# Patient Record
Sex: Male | Born: 1985 | Race: White | Hispanic: No | Marital: Single | Smoking: Current every day smoker
Health system: Southern US, Community
[De-identification: ages and names within clinical notes are randomized; demographics above are authoritative.]

## PROBLEM LIST (undated history)

## (undated) HISTORY — PX: MANDIBLE SURGERY: SHX707

---

## 2009-03-03 ENCOUNTER — Emergency Department (HOSPITAL_COMMUNITY): Admission: EM | Admit: 2009-03-03 | Discharge: 2009-03-03 | Payer: Self-pay | Admitting: *Deleted

## 2009-09-12 ENCOUNTER — Emergency Department: Payer: Self-pay | Admitting: Emergency Medicine

## 2010-01-29 ENCOUNTER — Emergency Department: Payer: Self-pay | Admitting: Emergency Medicine

## 2010-10-12 ENCOUNTER — Emergency Department: Payer: Self-pay | Admitting: Unknown Physician Specialty

## 2012-01-14 ENCOUNTER — Emergency Department: Payer: Self-pay | Admitting: Neurological Surgery

## 2012-01-20 ENCOUNTER — Emergency Department: Payer: Self-pay | Admitting: Emergency Medicine

## 2012-04-19 ENCOUNTER — Emergency Department: Payer: Self-pay | Admitting: Emergency Medicine

## 2012-04-22 LAB — BETA STREP CULTURE(ARMC)

## 2013-06-06 ENCOUNTER — Encounter (HOSPITAL_COMMUNITY): Payer: Self-pay

## 2013-06-06 ENCOUNTER — Emergency Department (HOSPITAL_COMMUNITY)
Admission: EM | Admit: 2013-06-06 | Discharge: 2013-06-06 | Disposition: A | Discharge status: Home / Self Care | Payer: Self-pay | Attending: Emergency Medicine | Admitting: Emergency Medicine

## 2013-06-06 DIAGNOSIS — F17200 Nicotine dependence, unspecified, uncomplicated: Secondary | ICD-10-CM | POA: Insufficient documentation

## 2013-06-06 DIAGNOSIS — K029 Dental caries, unspecified: Secondary | ICD-10-CM | POA: Insufficient documentation

## 2013-06-06 DIAGNOSIS — K0889 Other specified disorders of teeth and supporting structures: Secondary | ICD-10-CM

## 2013-06-06 DIAGNOSIS — Z880 Allergy status to penicillin: Secondary | ICD-10-CM | POA: Insufficient documentation

## 2013-06-06 DIAGNOSIS — K089 Disorder of teeth and supporting structures, unspecified: Secondary | ICD-10-CM | POA: Insufficient documentation

## 2013-06-06 MED ORDER — CEPHALEXIN 500 MG PO CAPS: 500.0000 mg | ORAL_CAPSULE | Freq: Four times a day (QID) | ORAL | Status: DC

## 2013-06-06 MED ORDER — HYDROCODONE-ACETAMINOPHEN 5-325 MG PO TABS: ORAL_TABLET | ORAL | Status: DC

## 2013-06-06 MED ORDER — OXYCODONE-ACETAMINOPHEN 5-325 MG PO TABS
1.0000 | ORAL_TABLET | Freq: Once | ORAL | Status: AC
  Administered 2013-06-06: 1 via ORAL
  Filled 2013-06-06: qty 1

## 2013-06-06 MED ORDER — CEPHALEXIN 500 MG PO CAPS
500.0000 mg | ORAL_CAPSULE | Freq: Once | ORAL | Status: AC
  Administered 2013-06-06: 500 mg via ORAL
  Filled 2013-06-06: qty 1

## 2013-06-06 NOTE — ED Notes (Signed)
Pain lt upper molar, that broke today when eating.

## 2013-06-06 NOTE — ED Provider Notes (Signed)
Medical screening examination/treatment/procedure(s) were performed by non-physician practitioner and as supervising physician I was immediately available for consultation/collaboration.   Lyanne Co, MD 06/06/13 [phone]

## 2013-06-06 NOTE — ED Notes (Signed)
Pt c/o toothache since eating lunch.  Says broke his tooth while eating some peanut brittle today.

## 2013-06-06 NOTE — ED Provider Notes (Signed)
History    CSN: [id number] Arrival date & time 06/06/13  1620  First MD Initiated Contact with Patient 06/06/13 1723     Chief Complaint  Patient presents with  . Dental Pain   (Consider location/radiation/quality/duration/timing/severity/associated sxs/prior Treatment) Patient is a 27 y.o. male presenting with tooth pain. The history is provided by the patient.  Dental Pain Location:  Upper Upper teeth location:  12/LU 1st bicuspid Quality:  Dull and throbbing Severity:  Moderate Onset quality:  Sudden Duration:  6 hours Timing:  Constant Progression:  Unchanged Chronicity:  New Context: dental caries, filling fell out and poor dentition   Context: not trauma   Prior workup: none. Relieved by:  Heat Worsened by:  Cold food/drink Ineffective treatments:  None tried Associated symptoms: no congestion, no difficulty swallowing, no drooling, no facial pain, no facial swelling, no fever, no gum swelling, no headaches, no neck pain, no neck swelling, no oral bleeding, no oral lesions and no trismus   Risk factors: periodontal disease and smoking    History reviewed. No pertinent past medical history. Past Surgical History  Procedure Laterality Date  . Mandible surgery     No family history on file. History  Substance Use Topics  . Smoking status: Current Every Day Smoker  . Smokeless tobacco: Not on file  . Alcohol Use: No    Review of Systems  Constitutional: Negative for fever and appetite change.  HENT: Positive for dental problem. Negative for congestion, sore throat, facial swelling, drooling, mouth sores, trouble swallowing, neck pain and neck stiffness.   Eyes: Negative for pain and visual disturbance.  Neurological: Negative for dizziness, facial asymmetry and headaches.  Hematological: Negative for adenopathy.  All other systems reviewed and are negative.    Allergies  Penicillins and Sulfa antibiotics  Home Medications   Current Outpatient Rx  Name   Route  Sig  Dispense  Refill  . ibuprofen (ADVIL,MOTRIN) 200 MG tablet   Oral   Take 200 mg by mouth every 6 (six) hours as needed for pain.          BP 135/79  Pulse 81  SpO2 100% Physical Exam  Nursing note and vitals reviewed. Constitutional: He is oriented to person, place, and time. He appears well-developed and well-nourished. No distress.  HENT:  Head: Normocephalic and atraumatic. No trismus in the jaw.  Right Ear: Tympanic membrane and ear canal normal.  Left Ear: Tympanic membrane and ear canal normal.  Mouth/Throat: Uvula is midline, oropharynx is clear and moist and mucous membranes are normal. Dental caries present. No dental abscesses or edematous.    Dental caries and ttp of the left upper bicuspid. No trismus, facial edema or dental abscess  Neck: Normal range of motion. Neck supple.  Cardiovascular: Normal rate, regular rhythm and normal heart sounds.   No murmur heard. Pulmonary/Chest: Effort normal and breath sounds normal. No respiratory distress.  Musculoskeletal: Normal range of motion.  Lymphadenopathy:    He has no cervical adenopathy.  Neurological: He is alert and oriented to person, place, and time. He exhibits normal muscle tone. Coordination normal.  Skin: Skin is warm and dry.    ED Course  Procedures (including critical care time) Labs Reviewed - No data to display   MDM   ttp of the left upper bicuspid.  Dental decay preset w/o trismus, facial swelling, or obvious abscess.  Pt agrees to warm salt water rinses and close dental follow-up.  VSS.  Appears stable for discharge  [place] Katich L. Trisha Mangle, PA-C 06/06/13 1809

## 2013-07-19 ENCOUNTER — Encounter (HOSPITAL_COMMUNITY): Payer: Self-pay | Admitting: *Deleted

## 2013-07-19 ENCOUNTER — Emergency Department (HOSPITAL_COMMUNITY)
Admission: EM | Admit: 2013-07-19 | Discharge: 2013-07-19 | Disposition: A | Discharge status: Home / Self Care | Payer: Medicaid Other | Attending: Emergency Medicine | Admitting: Emergency Medicine

## 2013-07-19 ENCOUNTER — Emergency Department (HOSPITAL_COMMUNITY): Payer: Medicaid Other

## 2013-07-19 DIAGNOSIS — Z880 Allergy status to penicillin: Secondary | ICD-10-CM | POA: Insufficient documentation

## 2013-07-19 DIAGNOSIS — IMO0002 Reserved for concepts with insufficient information to code with codable children: Secondary | ICD-10-CM | POA: Insufficient documentation

## 2013-07-19 DIAGNOSIS — S0003XA Contusion of scalp, initial encounter: Secondary | ICD-10-CM | POA: Insufficient documentation

## 2013-07-19 DIAGNOSIS — Z9889 Other specified postprocedural states: Secondary | ICD-10-CM | POA: Insufficient documentation

## 2013-07-19 DIAGNOSIS — S0083XA Contusion of other part of head, initial encounter: Secondary | ICD-10-CM

## 2013-07-19 DIAGNOSIS — F17200 Nicotine dependence, unspecified, uncomplicated: Secondary | ICD-10-CM | POA: Insufficient documentation

## 2013-07-19 DIAGNOSIS — Y92009 Unspecified place in unspecified non-institutional (private) residence as the place of occurrence of the external cause: Secondary | ICD-10-CM | POA: Insufficient documentation

## 2013-07-19 DIAGNOSIS — Y9389 Activity, other specified: Secondary | ICD-10-CM | POA: Insufficient documentation

## 2013-07-19 MED ORDER — IBUPROFEN 600 MG PO TABS: 600.0000 mg | ORAL_TABLET | Freq: Four times a day (QID) | ORAL | Status: DC | PRN

## 2013-07-19 MED ORDER — OXYCODONE-ACETAMINOPHEN 5-325 MG PO TABS: 1.0000 | ORAL_TABLET | ORAL | Status: DC | PRN

## 2013-07-19 NOTE — ED Notes (Signed)
Pt was trying to take a garage door out at his house and he kicked a 2x4 and it snapped back and hit him in the left side of his face. Pt has had his left jaw broken twice and he also has screws in his left jaw. Pt c/o numbness to face.

## 2013-07-19 NOTE — ED Notes (Signed)
Pt alert & oriented x4, stable gait. Patient given discharge instructions, paperwork & prescription(s). Patient  instructed to stop at the registration desk to finish any additional paperwork. Patient verbalized understanding. Pt left department w/ no further questions. 

## 2013-07-22 NOTE — ED Provider Notes (Signed)
CSN: [id number]     Arrival date & time 07/19/13  1829 History   First MD Initiated Contact with Patient 07/19/13 2011     Chief Complaint  Patient presents with  . Facial Injury   (Consider location/radiation/quality/duration/timing/severity/associated sxs/prior Treatment) HPI Comments: Matthew Odonnell is a 27 y.o. Male presenting for evaluation of left facial injury and pain after he was struck by a flying piece of 2x4 while he was tearing down a garage door at his home.  The injury occurred just prior to arrival.  He describes constant pain across his left jaw which is worsened with palpation and when he extends the jaw.  He has a history of prior jaw traumas including one with surgical repair. He feels numb along the left jawline.  He has had  No medcines or treatment prior to arrival and has found no alleviators.  He denies head injury or syncope from the event.     The history is provided by the patient.    History reviewed. No pertinent past medical history. Past Surgical History  Procedure Laterality Date  . Mandible surgery     History reviewed. No pertinent family history. History  Substance Use Topics  . Smoking status: Current Every Day Smoker  . Smokeless tobacco: Not on file  . Alcohol Use: No    Review of Systems  Constitutional: Negative for fever.  HENT: Negative for hearing loss, ear pain, nosebleeds, congestion, sore throat, facial swelling, sneezing, neck pain and neck stiffness.   Eyes: Negative.   Respiratory: Negative for chest tightness and shortness of breath.   Cardiovascular: Negative for chest pain.  Gastrointestinal: Negative for nausea and abdominal pain.  Genitourinary: Negative.   Musculoskeletal: Negative for joint swelling and arthralgias.  Skin: Negative.  Negative for rash and wound.  Neurological: Negative for dizziness, weakness, light-headedness, numbness and headaches.  Psychiatric/Behavioral: Negative.     Allergies  Penicillins and  Sulfa antibiotics  Home Medications   Current Outpatient Rx  Name  Route  Sig  Dispense  Refill  . HYDROcodone-acetaminophen (NORCO/VICODIN) 5-325 MG per tablet   Oral   Take 1 tablet by mouth once as needed for pain.         Marland Kitchen ibuprofen (ADVIL,MOTRIN) 200 MG tablet   Oral   Take 200 mg by mouth every 6 (six) hours as needed for pain.         Marland Kitchen ibuprofen (ADVIL,MOTRIN) 600 MG tablet   Oral   Take 1 tablet (600 mg total) by mouth every 6 (six) hours as needed for pain.   20 tablet   0   . oxyCODONE-acetaminophen (PERCOCET/ROXICET) 5-325 MG per tablet   Oral   Take 1 tablet by mouth every 4 (four) hours as needed for pain.   12 tablet   0    BP 132/80  Pulse 106  Temp(Src) 98.6 F (37 C)  Resp 20  Ht 5\' 11"  (1.803 m)  Wt 140 lb (63.504 kg)  BMI 19.53 kg/m2  SpO2 100% Physical Exam  Nursing note and vitals reviewed. Constitutional: He appears well-developed and well-nourished.  HENT:  Head: Normocephalic.    Left Ear: No swelling or tenderness. No mastoid tenderness. No hemotympanum.  Mouth/Throat: Uvula is midline. Normal dentition.  ttp along left mandible, mild erythema without edema or induration.  FROM of mandible without crepitus.  Eyes: Conjunctivae are normal.  Neck: Normal range of motion.  Cardiovascular: Normal rate, regular rhythm, normal heart sounds and intact distal pulses.  Pulmonary/Chest: Effort normal and breath sounds normal. He has no wheezes.  Abdominal: Soft. Bowel sounds are normal. There is no tenderness.  Musculoskeletal: Normal range of motion.  Neurological: He is alert. A sensory deficit is present. No cranial nerve deficit.  Decreased sensation left jaw/mandibular branch of trigeminal nerve.  Skin: Skin is warm and dry.  Psychiatric: He has a normal mood and affect.    ED Course  Procedures (including critical care time) Labs Review Labs Reviewed - No data to display Imaging Review No results found.  MDM   1. Facial  contusion, initial encounter    Patients labs and/or radiological studies were viewed and considered during the medical decision making and disposition process. Pt was prescribed oxycodone, ibuprofen, encouraged ice packs for the next 2 days prn, expect gradual improvement.  No cranial nerve motor deficit on exam but decreased fine touch left face along trigeminal nerve mandible.  Expect this to improve with resolution of trauma, which was discussed with pt,  However advised f/u with ent if it does improve over the next week,  Referral given.    Burgess Amor, PA-C 07/22/13 1550

## 2013-07-25 ENCOUNTER — Encounter (HOSPITAL_COMMUNITY): Payer: Self-pay | Admitting: *Deleted

## 2013-07-25 ENCOUNTER — Emergency Department (HOSPITAL_COMMUNITY)
Admission: EM | Admit: 2013-07-25 | Discharge: 2013-07-25 | Disposition: A | Discharge status: Home / Self Care | Payer: Medicaid Other | Attending: Emergency Medicine | Admitting: Emergency Medicine

## 2013-07-25 DIAGNOSIS — Z880 Allergy status to penicillin: Secondary | ICD-10-CM | POA: Insufficient documentation

## 2013-07-25 DIAGNOSIS — Z9889 Other specified postprocedural states: Secondary | ICD-10-CM | POA: Insufficient documentation

## 2013-07-25 DIAGNOSIS — F17200 Nicotine dependence, unspecified, uncomplicated: Secondary | ICD-10-CM | POA: Insufficient documentation

## 2013-07-25 DIAGNOSIS — R6884 Jaw pain: Secondary | ICD-10-CM

## 2013-07-25 DIAGNOSIS — R209 Unspecified disturbances of skin sensation: Secondary | ICD-10-CM | POA: Insufficient documentation

## 2013-07-25 MED ORDER — TRAMADOL HCL 50 MG PO TABS: 50.0000 mg | ORAL_TABLET | Freq: Four times a day (QID) | ORAL | Status: DC | PRN

## 2013-07-25 MED ORDER — TRAMADOL HCL 50 MG PO TABS
50.0000 mg | ORAL_TABLET | Freq: Once | ORAL | Status: AC
  Administered 2013-07-25: 50 mg via ORAL
  Filled 2013-07-25: qty 1

## 2013-07-25 NOTE — ED Notes (Signed)
Pt hit in face last week while taking down a building. Pt seen in ER then. Unable to follow up at this time. Pt states still having pain & lip is numb.

## 2013-07-25 NOTE — ED Provider Notes (Signed)
CSN: [id number]     Arrival date & time 07/25/13  1948 History   First MD Initiated Contact with Patient 07/25/13 2031     Chief Complaint  Patient presents with  . Facial Pain   (Consider location/radiation/quality/duration/timing/severity/associated sxs/prior Treatment) HPI Comments: Matthew Odonnell is a 27 y.o. male who presents to the Emergency Department complaining of persistent pain to the left jaw.  He was seen here on 07/19/13 for same after a direct blow to his jaw by a piece of wood that fell while he was tearing down a shed.  Patient reports having a previous mandible fracture in the same area. He also c/o numbness and tingling to his lower lip and along the jaw that has also been present since the initial injury.  He states that he has made an appt with an ENT doctor, but his appt is not until next week and he has ran out of the pain medication he was prescribed.  He denies neck pain, visual changes, headaches, vomiting or dizziness.    The history is provided by the patient.    History reviewed. No pertinent past medical history. Past Surgical History  Procedure Laterality Date  . Mandible surgery     No family history on file. History  Substance Use Topics  . Smoking status: Current Every Day Smoker  . Smokeless tobacco: Not on file  . Alcohol Use: No    Review of Systems  Constitutional: Negative for fever, chills, activity change and appetite change.  HENT: Negative for ear pain, sore throat, facial swelling, mouth sores, trouble swallowing, neck pain, neck stiffness and voice change.        Pain to left jaw  Eyes: Negative for visual disturbance.  Skin: Negative for rash and wound.  Neurological: Negative for dizziness, facial asymmetry, speech difficulty, weakness, light-headedness, numbness and headaches.  All other systems reviewed and are negative.    Allergies  Penicillins and Sulfa antibiotics  Home Medications   Current Outpatient Rx  Name  Route  Sig   Dispense  Refill  . HYDROcodone-acetaminophen (NORCO/VICODIN) 5-325 MG per tablet   Oral   Take 1 tablet by mouth once as needed for pain.         Marland Kitchen ibuprofen (ADVIL,MOTRIN) 200 MG tablet   Oral   Take 200 mg by mouth every 6 (six) hours as needed for pain.         Marland Kitchen ibuprofen (ADVIL,MOTRIN) 600 MG tablet   Oral   Take 1 tablet (600 mg total) by mouth every 6 (six) hours as needed for pain.   20 tablet   0   . oxyCODONE-acetaminophen (PERCOCET/ROXICET) 5-325 MG per tablet   Oral   Take 1 tablet by mouth every 4 (four) hours as needed for pain.   12 tablet   0    BP 149/86  Pulse 120  Temp(Src) 98 F (36.7 C) (Oral)  Resp 20  Ht 5\' 11"  (1.803 m)  Wt 140 lb (63.504 kg)  BMI 19.53 kg/m2  SpO2 100% Physical Exam  Nursing note and vitals reviewed. Constitutional: He is oriented to person, place, and time. He appears well-developed and well-nourished. No distress.  HENT:  Head: Normocephalic.    Mouth/Throat: Uvula is midline, oropharynx is clear and moist and mucous membranes are normal. No oral lesions. No trismus in the jaw. Dental caries present. No edematous or lacerations.  Patient has ttp of the lower mid mandible,  No bony deformity, edema, or dental abscess.  No trismus or malocclusion.  Mild edema of the left lower lip with slight abrasion of the lip present.  No laceration  Eyes: Conjunctivae and EOM are normal. Pupils are equal, round, and reactive to light.  Neck: Normal range of motion, full passive range of motion without pain and phonation normal. Neck supple.  Cardiovascular: Normal rate, regular rhythm, normal heart sounds and intact distal pulses.   No murmur heard. Pulmonary/Chest: Effort normal and breath sounds normal. No respiratory distress. He exhibits no tenderness.  Musculoskeletal: Normal range of motion. He exhibits no edema.  Lymphadenopathy:    He has no cervical adenopathy.  Neurological: He is alert and oriented to person, place, and  time. He exhibits normal muscle tone. Coordination normal.  Skin: Skin is warm and dry.    ED Course  Procedures (including critical care time) Labs Review Labs Reviewed - No data to display Imaging Review No results found.  MDM    Previous ED chart reviewed by me including CT results.  [place] narcotics database reviewed.  Patient continues to have pain to the mid left mandible. No edema, erythema, or TMJ tenderness. Patient has full range of motion of the jaw. Decreased pinpoint sensation along the left lower jaw line. There is mild bruising of the left lower lip. Patient reports these issues have not diminished since his previous visit. Patient does report having a followup appointment with ENT next week.  Have explained to the patient that the ER is not the proper location for chronic pain management he verbalizes understanding and agrees to the care plan.  Patient is well appearing and stable for discharge   Safaa Stingley L. Perris Conwell, PA-C 07/27/13 0106

## 2013-07-25 NOTE — ED Notes (Signed)
Pain lt mandible, says he was hit with a 2x4 last week and was seen here for same. . Numb sensation to chin,

## 2013-07-26 NOTE — ED Provider Notes (Signed)
Medical screening examination/treatment/procedure(s) were performed by non-physician practitioner and as supervising physician I was immediately available for consultation/collaboration.'   Alezandra Egli W Adah Stoneberg, MD 07/26/13 1138 

## 2013-07-28 NOTE — ED Provider Notes (Signed)
Medical screening examination/treatment/procedure(s) were performed by non-physician practitioner and as supervising physician I was immediately available for consultation/collaboration.  Damon Baisch, MD 07/28/13 1148 

## 2014-01-02 ENCOUNTER — Emergency Department: Payer: Self-pay | Admitting: Emergency Medicine

## 2014-04-06 ENCOUNTER — Emergency Department: Payer: Self-pay | Admitting: Emergency Medicine

## 2014-04-06 LAB — URINALYSIS, COMPLETE
Bacteria: NONE SEEN
Bilirubin,UR: NEGATIVE
Glucose,UR: NEGATIVE mg/dL
Ketone: NEGATIVE
Leukocyte Esterase: NEGATIVE
Nitrite: NEGATIVE
Ph: 6
Protein: NEGATIVE
RBC,UR: 698 /HPF
Specific Gravity: 1.012
Squamous Epithelial: NONE SEEN
WBC UR: 3 /HPF

## 2015-03-05 ENCOUNTER — Emergency Department
Admit: 2015-03-05 | Disposition: A | Discharge status: Home / Self Care | Payer: Self-pay | Admitting: Emergency Medicine

## 2015-09-07 ENCOUNTER — Emergency Department
Admission: EM | Admit: 2015-09-07 | Discharge: 2015-09-07 | Disposition: A | Discharge status: Home / Self Care | Payer: Self-pay | Attending: Emergency Medicine | Admitting: Emergency Medicine

## 2015-09-07 ENCOUNTER — Encounter: Payer: Self-pay | Admitting: Emergency Medicine

## 2015-09-07 DIAGNOSIS — K047 Periapical abscess without sinus: Secondary | ICD-10-CM | POA: Insufficient documentation

## 2015-09-07 DIAGNOSIS — Z720 Tobacco use: Secondary | ICD-10-CM | POA: Insufficient documentation

## 2015-09-07 DIAGNOSIS — Z880 Allergy status to penicillin: Secondary | ICD-10-CM | POA: Insufficient documentation

## 2015-09-07 MED ORDER — LIDOCAINE-EPINEPHRINE 2 %-1:100000 IJ SOLN
1.7000 mL | Freq: Once | INTRAMUSCULAR | Status: AC
Start: 2015-09-07 — End: 2015-09-07
  Administered 2015-09-07: 1.7 mL
  Filled 2015-09-07: qty 1.7

## 2015-09-07 MED ORDER — DOXYCYCLINE HYCLATE 100 MG PO TABS: 100.0000 mg | ORAL_TABLET | Freq: Two times a day (BID) | ORAL | Status: DC

## 2015-09-07 MED ORDER — MAGIC MOUTHWASH W/LIDOCAINE: 5.0000 mL | Freq: Four times a day (QID) | ORAL | Status: DC

## 2015-09-07 MED ORDER — OXYCODONE-ACETAMINOPHEN 5-325 MG PO TABS: 1.0000 | ORAL_TABLET | Freq: Four times a day (QID) | ORAL | Status: DC | PRN

## 2015-09-07 NOTE — ED Provider Notes (Signed)
[facility] Emergency Department Provider Note  ____________________________________________  Time seen: Approximately 6:09 PM  I have reviewed the triage vital signs and the nursing notes.   HISTORY  Chief Complaint Dental Pain    HPI Matthew Odonnell is a 29 y.o. male who presents to emergency department complaining of left upper jaw/tooth pain. He states that he has had intermittent pain over the last couple months but over the last 24 hours the pain is increased and he is experiencing swelling to the lateral aspect of his face. He denies any fevers or chills. He reports one episode of vomiting that was clear.Marland Kitchen. He denies any difficulty breathing or swallowing. He states that he has poor dentition but does not have a regular dentist. States the pain is severe.   History reviewed. No pertinent past medical history.  There are no active problems to display for this patient.   Past Surgical History  Procedure Laterality Date  . Mandible surgery      Current Outpatient Rx  Name  Route  Sig  Dispense  Refill  . doxycycline (VIBRA-TABS) 100 MG tablet   Oral   Take 1 tablet (100 mg total) by mouth 2 (two) times daily.   14 tablet   0   . HYDROcodone-acetaminophen (NORCO/VICODIN) 5-325 MG per tablet   Oral   Take 1 tablet by mouth once as needed for pain.         Marland Kitchen. ibuprofen (ADVIL,MOTRIN) 200 MG tablet   Oral   Take 200 mg by mouth every 6 (six) hours as needed for pain.         Marland Kitchen. ibuprofen (ADVIL,MOTRIN) 600 MG tablet   Oral   Take 1 tablet (600 mg total) by mouth every 6 (six) hours as needed for pain.   20 tablet   0   . magic mouthwash w/lidocaine SOLN   Oral   Take 5 mLs by mouth 4 (four) times daily.   240 mL   0     Dispense in a 1/1/1/1 ratio   . oxyCODONE-acetaminophen (ROXICET) 5-325 MG tablet   Oral   Take 1 tablet by mouth every 6 (six) hours as needed for severe pain.   20 tablet   0   . traMADol (ULTRAM) 50 MG  tablet   Oral   Take 1 tablet (50 mg total) by mouth every 6 (six) hours as needed for pain.   15 tablet   0     Allergies Penicillins and Sulfa antibiotics  No family history on file.  Social History Social History  Substance Use Topics  . Smoking status: Current Every Day Smoker  . Smokeless tobacco: None  . Alcohol Use: No    Review of Systems Constitutional: No fever/chills Eyes: No visual changes. ENT: No sore throat. Endorses left-sided dental pain. Cardiovascular: Denies chest pain. Respiratory: Denies shortness of breath. Gastrointestinal: No abdominal pain.  No nausea, no vomiting.  No diarrhea.  No constipation. Genitourinary: Negative for dysuria. Musculoskeletal: Negative for back pain. Skin: Negative for rash. Neurological: Negative for headaches, focal weakness or numbness.  10-point ROS otherwise negative.  ____________________________________________   PHYSICAL EXAM:  VITAL SIGNS: ED Triage Vitals  Enc Vitals Group     BP 09/07/15 1745 141/83 mmHg     Pulse Rate 09/07/15 1745 63     Resp 09/07/15 1745 18     Temp 09/07/15 1745 97.9 F (36.6 C)     Temp Source 09/07/15 1745 Oral  SpO2 09/07/15 1745 100 %     Weight 09/07/15 1745 140 lb (63.504 kg)     Height 09/07/15 1745  (1.753 m)     Head Cir --      Peak Flow --      Pain Score 09/07/15 1745 8     Pain Loc --      Pain Edu? --      Excl. in GC? --     Constitutional: Alert and oriented. Well appearing and in no acute distress. Eyes: Conjunctivae are normal. PERRL. EOMI. Head: Atraumatic. Nose: No congestion/rhinnorhea. Mouth/Throat: Mucous membranes are moist.  Oropharynx non-erythematous. Neck: No stridor.   Hematological/Lymphatic/Immunilogical: No cervical lymphadenopathy. Cardiovascular: Normal rate, regular rhythm. Grossly normal heart sounds.  Good peripheral circulation. Respiratory: Normal respiratory effort.  No retractions. Lungs CTAB. Gastrointestinal: Soft and  nontender. No distention. No abdominal bruits. No CVA tenderness. Musculoskeletal: No lower extremity tenderness nor edema.  No joint effusions. Neurologic:  Normal speech and language. No gross focal neurologic deficits are appreciated. No gait instability. Skin:  Skin is warm, dry and intact. No rash noted. Psychiatric: Mood and affect are normal. Speech and behavior are normal.  ____________________________________________   LABS (all labs ordered are listed, but only abnormal results are displayed)  Labs Reviewed - No data to display ____________________________________________  EKG   ____________________________________________  RADIOLOGY   ____________________________________________   PROCEDURES  Procedure(s) performed: Yes, dental block. 1.7 MLS of lidocaine with epi were administered for dental block to the left upper jaw. He tolerated procedure well. Her scalp patient.  Critical Care performed: No  ____________________________________________   INITIAL IMPRESSION / ASSESSMENT AND PLAN / ED COURSE  Pertinent labs & imaging results that were available during my care of the patient were reviewed by me and considered in my medical decision making (see chart for details).  The patient is a 29 year old male who presents to emergency department complaining of left upper jaw dental pain. He endorses some swelling. Exam was consistent with dental abscess. The patient is placed on antibiotics, Percocet, and magic mouthwash for treatment. Patient is advised to follow-up with [facility] for further evaluation and treatment. Patient verbalizes understanding of diagnosis and verbalizes understanding of the treatment plan. He verbalizes compliance with same. ____________________________________________   FINAL CLINICAL IMPRESSION(S) / ED DIAGNOSES  Final diagnoses:  Dental abscess      Racheal Patches, PA-C 09/07/15 1857  Governor Rooks, MD 09/09/15 1108

## 2015-09-07 NOTE — ED Notes (Signed)
C/o toothache pain to left side with facial swelling

## 2015-09-07 NOTE — Discharge Instructions (Signed)
Dental Abscess °A dental abscess is a collection of pus in or around a tooth. °CAUSES °This condition is caused by a bacterial infection around the root of the tooth that involves the inner part of the tooth (pulp). It may result from: °· Severe tooth decay. °· Trauma to the tooth that allows bacteria to enter into the pulp, such as a broken or chipped tooth. °· Severe gum disease around a tooth. °SYMPTOMS °Symptoms of this condition include: °· Severe pain in and around the infected tooth. °· Swelling and redness around the infected tooth, in the mouth, or in the face. °· Tenderness. °· Pus drainage. °· Bad breath. °· Bitter taste in the mouth. °· Difficulty swallowing. °· Difficulty opening the mouth. °· Nausea. °· Vomiting. °· Chills. °· Swollen neck glands. °· Fever. °DIAGNOSIS °This condition is diagnosed with examination of the infected tooth. During the exam, your dentist may tap on the infected tooth. Your dentist will also ask about your medical and dental history and may order X-rays. °TREATMENT °This condition is treated by eliminating the infection. This may be done with: °· Antibiotic medicine. °· A root canal. This may be performed to save the tooth. °· Pulling (extracting) the tooth. This may also involve draining the abscess. This is done if the tooth cannot be saved. °HOME CARE INSTRUCTIONS °· Take medicines only as directed by your dentist. °· If you were prescribed antibiotic medicine, finish all of it even if you start to feel better. °· Rinse your mouth (gargle) often with salt water to relieve pain or swelling. °· Do not drive or operate heavy machinery while taking pain medicine. °· Do not apply heat to the outside of your mouth. °· Keep all follow-up visits as directed by your dentist. This is important. °SEEK MEDICAL CARE IF: °· Your pain is worse and is not helped by medicine. °SEEK IMMEDIATE MEDICAL CARE IF: °· You have a fever or chills. °· Your symptoms suddenly get worse. °· You have a  very bad headache. °· You have problems breathing or swallowing. °· You have trouble opening your mouth. °· You have swelling in your neck or around your eye. °  °This information is not intended to replace advice given to you by your health care provider. Make sure you discuss any questions you have with your health care provider. °  °Document Released: 11/03/2005 Document Revised: 03/20/2015 Document Reviewed: 10/31/2014 °Elsevier Interactive Patient Education ©2016 Elsevier Inc. ° °Dental Pain °Dental pain may be caused by many things, including: °· Tooth decay (cavities or caries). Cavities expose the nerve of your tooth to air and hot or cold temperatures. This can cause pain or discomfort. °· Abscess or infection. A dental abscess is a collection of infected pus from a bacterial infection in the inner part of the tooth (pulp). It usually occurs at the end of the tooth's root. °· Injury. °· An unknown reason (idiopathic). °Your pain may be mild or severe. It may only occur when: °· You are chewing. °· You are exposed to hot or cold temperature. °· You are eating or drinking sugary foods or beverages, such as soda or candy. °Your pain may also be constant. °HOME CARE INSTRUCTIONS °Watch your dental pain for any changes. The following actions may help to lessen any discomfort that you are feeling: °· Take medicines only as directed by your dentist. °· If you were prescribed an antibiotic medicine, finish all of it even if you start to feel better. °· Keep all   follow-up visits as directed by your dentist. This is important. °· Do not apply heat to the outside of your face. °· Rinse your mouth or gargle with salt water if directed by your dentist. This helps with pain and swelling. °¨ You can make salt water by adding ¼ tsp of salt to 1 cup of warm water. °· Apply ice to the painful area of your face: °¨ Put ice in a plastic bag. °¨ Place a towel between your skin and the bag. °¨ Leave the ice on for 20 minutes,  2-3 times per day. °· Avoid foods or drinks that cause you pain, such as: °¨ Very hot or very cold foods or drinks. °¨ Sweet or sugary foods or drinks. °SEEK MEDICAL CARE IF: °· Your pain is not controlled with medicines. °· Your symptoms are worse. °· You have new symptoms. °SEEK IMMEDIATE MEDICAL CARE IF: °· You are unable to open your mouth. °· You are having trouble breathing or swallowing. °· You have a fever. °· Your face, neck, or jaw is swollen. °  °This information is not intended to replace advice given to you by your health care provider. Make sure you discuss any questions you have with your health care provider. °  °Document Released: 11/03/2005 Document Revised: 03/20/2015 Document Reviewed: 10/30/2014 °Elsevier Interactive Patient Education ©2016 Elsevier Inc. ° °

## 2015-10-31 ENCOUNTER — Encounter: Payer: Self-pay | Admitting: *Deleted

## 2015-10-31 ENCOUNTER — Emergency Department
Admission: EM | Admit: 2015-10-31 | Discharge: 2015-10-31 | Disposition: A | Discharge status: Home / Self Care | Payer: Medicaid Other | Attending: Emergency Medicine | Admitting: Emergency Medicine

## 2015-10-31 DIAGNOSIS — F17200 Nicotine dependence, unspecified, uncomplicated: Secondary | ICD-10-CM | POA: Insufficient documentation

## 2015-10-31 DIAGNOSIS — Z880 Allergy status to penicillin: Secondary | ICD-10-CM | POA: Insufficient documentation

## 2015-10-31 DIAGNOSIS — Z79899 Other long term (current) drug therapy: Secondary | ICD-10-CM | POA: Insufficient documentation

## 2015-10-31 DIAGNOSIS — Z792 Long term (current) use of antibiotics: Secondary | ICD-10-CM | POA: Insufficient documentation

## 2015-10-31 DIAGNOSIS — K029 Dental caries, unspecified: Secondary | ICD-10-CM | POA: Insufficient documentation

## 2015-10-31 DIAGNOSIS — K047 Periapical abscess without sinus: Secondary | ICD-10-CM

## 2015-10-31 MED ORDER — CLINDAMYCIN HCL 300 MG PO CAPS: 300.0000 mg | ORAL_CAPSULE | Freq: Four times a day (QID) | ORAL | Status: DC

## 2015-10-31 MED ORDER — TRAMADOL HCL 50 MG PO TABS: 50.0000 mg | ORAL_TABLET | Freq: Two times a day (BID) | ORAL | Status: DC

## 2015-10-31 NOTE — ED Notes (Signed)
Pt states 2 abscess teeth on the left upper, states pus pocked in his mouth

## 2015-10-31 NOTE — Discharge Instructions (Signed)
Dental Abscess °A dental abscess is a collection of pus in or around a tooth. °CAUSES °This condition is caused by a bacterial infection around the root of the tooth that involves the inner part of the tooth (pulp). It may result from: °· Severe tooth decay. °· Trauma to the tooth that allows bacteria to enter into the pulp, such as a broken or chipped tooth. °· Severe gum disease around a tooth. °SYMPTOMS °Symptoms of this condition include: °· Severe pain in and around the infected tooth. °· Swelling and redness around the infected tooth, in the mouth, or in the face. °· Tenderness. °· Pus drainage. °· Bad breath. °· Bitter taste in the mouth. °· Difficulty swallowing. °· Difficulty opening the mouth. °· Nausea. °· Vomiting. °· Chills. °· Swollen neck glands. °· Fever. °DIAGNOSIS °This condition is diagnosed with examination of the infected tooth. During the exam, your dentist may tap on the infected tooth. Your dentist will also ask about your medical and dental history and may order X-rays. °TREATMENT °This condition is treated by eliminating the infection. This may be done with: °· Antibiotic medicine. °· A root canal. This may be performed to save the tooth. °· Pulling (extracting) the tooth. This may also involve draining the abscess. This is done if the tooth cannot be saved. °HOME CARE INSTRUCTIONS °· Take medicines only as directed by your dentist. °· If you were prescribed antibiotic medicine, finish all of it even if you start to feel better. °· Rinse your mouth (gargle) often with salt water to relieve pain or swelling. °· Do not drive or operate heavy machinery while taking pain medicine. °· Do not apply heat to the outside of your mouth. °· Keep all follow-up visits as directed by your dentist. This is important. °SEEK MEDICAL CARE IF: °· Your pain is worse and is not helped by medicine. °SEEK IMMEDIATE MEDICAL CARE IF: °· You have a fever or chills. °· Your symptoms suddenly get worse. °· You have a  very bad headache. °· You have problems breathing or swallowing. °· You have trouble opening your mouth. °· You have swelling in your neck or around your eye. °  °This information is not intended to replace advice given to you by your health care provider. Make sure you discuss any questions you have with your health care provider. °  °Document Released: 11/03/2005 Document Revised: 03/20/2015 Document Reviewed: 10/31/2014 °Elsevier Interactive Patient Education ©2016 Elsevier Inc. ° °Dental Caries °Dental caries (also called tooth decay) is the most common oral disease. It can occur at any age but is more common in children and young adults.  °HOW DENTAL CARIES DEVELOPS  °The process of decay begins when bacteria and foods (particularly sugars and starches) combine in your mouth to produce plaque. Plaque is a substance that sticks to the hard, outer surface of a tooth (enamel). The bacteria in plaque produce acids that attack enamel. These acids may also attack the root surface of a tooth (cementum) if it is exposed. Repeated attacks dissolve these surfaces and create holes in the tooth (cavities). If left untreated, the acids destroy the other layers of the tooth.  °RISK FACTORS °· Frequent sipping of sugary beverages.   °· Frequent snacking on sugary and starchy foods, especially those that easily get stuck in the teeth.   °· Poor oral hygiene.   °· Dry mouth.   °· Substance abuse such as methamphetamine abuse.   °· Broken or poor-fitting dental restorations.   °· Eating disorders.   °· Gastroesophageal reflux disease (  GERD).   Certain radiation treatments to the head and neck. SYMPTOMS In the early stages of dental caries, symptoms are seldom present. Sometimes white, chalky areas may be seen on the enamel or other tooth layers. In later stages, symptoms may include:  Pits and holes on the enamel.  Toothache after sweet, hot, or cold foods or drinks are consumed.  Pain around the tooth.  Swelling  around the tooth. DIAGNOSIS  Most of the time, dental caries is detected during a regular dental checkup. A diagnosis is made after a thorough medical and dental history is taken and the surfaces of your teeth are checked for signs of dental caries. Sometimes special instruments, such as lasers, are used to check for dental caries. Dental X-ray exams may be taken so that areas not visible to the eye (such as between the contact areas of the teeth) can be checked for cavities.  TREATMENT  If dental caries is in its early stages, it may be reversed with a fluoride treatment or an application of a remineralizing agent at the dental office. Thorough brushing and flossing at home is needed to aid these treatments. If it is in its later stages, treatment depends on the location and extent of tooth destruction:   If a small area of the tooth has been destroyed, the destroyed area will be removed and cavities will be filled with a material such as gold, silver amalgam, or composite resin.   If a large area of the tooth has been destroyed, the destroyed area will be removed and a cap (crown) will be fitted over the remaining tooth structure.   If the center part of the tooth (pulp) is affected, a procedure called a root canal will be needed before a filling or crown can be placed.   If most of the tooth has been destroyed, the tooth may need to be pulled (extracted). HOME CARE INSTRUCTIONS You can prevent, stop, or reverse dental caries at home by practicing good oral hygiene. Good oral hygiene includes:  Thoroughly cleaning your teeth at least twice a day with a toothbrush and dental floss.   Using a fluoride toothpaste. A fluoride mouth rinse may also be used if recommended by your dentist or health care provider.   Restricting the amount of sugary and starchy foods and sugary liquids you consume.   Avoiding frequent snacking on these foods and sipping of these liquids.   Keeping regular  visits with a dentist for checkups and cleanings. PREVENTION   Practice good oral hygiene.  Consider a dental sealant. A dental sealant is a coating material that is applied by your dentist to the pits and grooves of teeth. The sealant prevents food from being trapped in them. It may protect the teeth for several years.  Ask about fluoride supplements if you live in a community without fluorinated water or with water that has a low fluoride content. Use fluoride supplements as directed by your dentist or health care provider.  Allow fluoride varnish applications to teeth if directed by your dentist or health care provider.   This information is not intended to replace advice given to you by your health care provider. Make sure you discuss any questions you have with your health care provider.   Document Released: 07/26/2002 Document Revised: 11/24/2014 Document Reviewed: 11/05/2012 Elsevier Interactive Patient Education 2016 [employer].  OPTIONS FOR DENTAL FOLLOW UP CARE  [place] Department of Health and Human Services - Local Safety Net Dental Clinics [email or web address]  [facility] [phone])  [facility] [phone])  [facility] [phone]  [facility] [phone])  [facility] [phone]) This clinic caters to the indigent population and is on a lottery system. Location: [employer] [address], [address], [place] [facility] Hours: Wednesdays from 6pm - 9pm, patients seen by a lottery system. For dates, call or go to [email or web address] Services: Cleanings, fillings and simple extractions. Payment Options: DENTAL WORK IS FREE OF CHARGE. Bring proof of income or support. Best way to get seen: Arrive at 5:15 pm - this is a lottery, NOT first come/first serve, so arriving earlier will not increase your  chances of being seen.     [facility] [phone] Select option 1 for emergencies   Location: [facility], [address], [address], [place] [facility] Hours: No walk-ins accepted - call the day before to schedule an appointment. Check in times are 9:30 am and 1:30 pm. Services: Simple extractions, temporary fillings, pulpectomy/pulp debridement, uncomplicated abscess drainage. Payment Options: PAYMENT IS DUE AT THE TIME OF SERVICE.  Fee is usually $100-200, additional surgical procedures (e.g. abscess drainage) may be extra. Cash, checks, Visa/MasterCard accepted.  Can file Medicaid if patient is covered for dental - patient should call case worker to check. No discount for [facility] patients. Best way to get seen: MUST call the day before and get onto the schedule. Can usually be seen the next 1-2 days. No walk-ins accepted.     [facility] [phone]   Location: [facility], [address], [place] [facility] Hours: M, W, Th, F 8am or 1:30pm, Tues 9a or 1:30 - first come/first served. Services: Simple extractions, temporary fillings, uncomplicated abscess drainage.  You do not need to be [place] resident. Payment Options: PAYMENT IS DUE AT THE TIME OF SERVICE. Dental insurance, otherwise sliding scale - bring proof of income or support. Depending on income and treatment needed, cost is usually $50-200. Best way to get seen: Arrive early as it is first come/first served.     [facility] [phone]   Location: [address] Clinic Hours: Mon-Thu 8a-5p Services: Most basic dental services including extractions and fillings. Payment Options: PAYMENT IS DUE AT THE TIME OF SERVICE. Sliding scale, up to 50% off - bring proof if income or support. Medicaid with dental option accepted. Best way to get seen: Call to schedule an  appointment, can usually be seen within 2 weeks OR they will try to see walk-ins - show up at 8a or 2p (you may have to wait).     [facility] [phone] [place] RESIDENTS ONLY   Location: [facility], [address], [place], [place] [id number] Clinic Hours: By appointment only. Monday - Thursday 8am-5pm, Friday 8am-12pm Services: Cleanings, fillings, extractions. Payment Options: PAYMENT IS DUE AT THE TIME OF SERVICE. Cash, Visa or MasterCard. Sliding scale - $30 minimum per service. Best way to get seen: Come in to office, complete packet and make an appointment - need proof of income or support monies for each household member and proof of [place] residence. Usually takes about a month to get in.     [facility] [phone]   Location: [address] [facility] Hours: Walk-in Urgent Care Dental Services are offered Monday-Friday mornings only. The numbers of emergencies accepted daily is limited to  the number of providers available. Maximum 15 - Mondays, Wednesdays & Thursdays Maximum 10 - Tuesdays & Fridays Services: You do not need to be a [facility] resident to be seen for a dental emergency. Emergencies are defined as pain, swelling, abnormal bleeding, or dental trauma. Walkins will receive x-rays if needed. NOTE: Dental cleaning is not an emergency. Payment Options: PAYMENT IS DUE AT THE TIME OF SERVICE. Minimum co-pay is $40.00 for uninsured patients. Minimum co-pay is $3.00 for Medicaid with dental coverage. Dental Insurance is accepted and must be presented at time of visit. Medicare does not cover dental. Forms of payment: Cash, credit card, checks. Best way to get seen: If not previously registered with the clinic, walk-in dental registration begins at 7:15 am and is on a first come/first serve basis. If previously registered with the clinic, call to make an appointment.      [facility] [phone] [place] RESIDENTS ONLY   Location: [address] [place], [place] Clinic Hours: Mon-Thu 10a-2p Services: Extractions only! Payment Options: FREE (donations accepted) - bring proof of income or support Best way to get seen: Call and schedule an appointment OR come at 8am on the 1st Monday of every month (except for holidays) when it is first come/first served.     [facility] [phone]   Location: [address] [employer], [place] Clinic Hours: Friday mornings Services, Payment Options, Best way to get seen: Call for info

## 2015-10-31 NOTE — ED Provider Notes (Signed)
[facility] Emergency Department Provider Note ____________________________________________  Time seen: 71940  I have reviewed the triage vital signs and the nursing notes.  HISTORY  Chief Complaint  Dental Pain  HPI Matthew Odonnell is a 29 y.o. male reports to the ED for evaluation of abscess to the upper jaw. He has a history of chronic cavities and tooth decay. He was seen here on at least 2 other occasions for dental abscess. He reports some purulent drainage on the left side of the upper jaw just above the first molar. He denies any interim fever, chills, sweats. He has not been evaluated by a local dental provider since the last visit.He describes his discomfort at 8/10 in triage.  History reviewed. No pertinent past medical history.  There are no active problems to display for this patient.  Past Surgical History  Procedure Laterality Date  . Mandible surgery      Current Outpatient Rx  Name  Route  Sig  Dispense  Refill  . clindamycin (CLEOCIN) 300 MG capsule   Oral   Take 1 capsule (300 mg total) by mouth 4 (four) times daily.   40 capsule   0   . doxycycline (VIBRA-TABS) 100 MG tablet   Oral   Take 1 tablet (100 mg total) by mouth 2 (two) times daily.   14 tablet   0   . HYDROcodone-acetaminophen (NORCO/VICODIN) 5-325 MG per tablet   Oral   Take 1 tablet by mouth once as needed for pain.         Marland Kitchen. ibuprofen (ADVIL,MOTRIN) 200 MG tablet   Oral   Take 200 mg by mouth every 6 (six) hours as needed for pain.         Marland Kitchen. ibuprofen (ADVIL,MOTRIN) 600 MG tablet   Oral   Take 1 tablet (600 mg total) by mouth every 6 (six) hours as needed for pain.   20 tablet   0   . magic mouthwash w/lidocaine SOLN   Oral   Take 5 mLs by mouth 4 (four) times daily.   240 mL   0     Dispense in a 1/1/1/1 ratio   . oxyCODONE-acetaminophen (ROXICET) 5-325 MG tablet   Oral   Take 1 tablet by mouth every 6 (six) hours as needed for severe pain.   20  tablet   0   . traMADol (ULTRAM) 50 MG tablet   Oral   Take 1 tablet (50 mg total) by mouth 2 (two) times daily.   10 tablet   0    Allergies Penicillins and Sulfa antibiotics  History reviewed. No pertinent family history.  Social History Social History  Substance Use Topics  . Smoking status: Current Every Day Smoker  . Smokeless tobacco: None  . Alcohol Use: No   Review of Systems  Constitutional: Negative for fever. Eyes: Negative for visual changes. ENT: Negative for sore throat. Dental abscess as above Cardiovascular: Negative for chest pain. Respiratory: Negative for shortness of breath. Gastrointestinal: Negative for abdominal pain, vomiting and diarrhea. Genitourinary: Negative for dysuria. Musculoskeletal: Negative for back pain. Skin: Negative for rash. Neurological: Negative for headaches, focal weakness or numbness. ____________________________________________  PHYSICAL EXAM:  VITAL SIGNS: ED Triage Vitals  Enc Vitals Group     BP 10/31/15 1828 140/82 mmHg     Pulse Rate 10/31/15 1828 94     Resp 10/31/15 1828 18     Temp 10/31/15 1828 98.2 F (36.8 C)     Temp  Source 10/31/15 1828 Oral     SpO2 10/31/15 1828 99 %     Weight 10/31/15 1828 140 lb (63.504 kg)     Height 10/31/15 1828  (1.778 m)     Head Cir --      Peak Flow --      Pain Score 10/31/15 1829 8     Pain Loc --      Pain Edu? --      Excl. in GC? --    Constitutional: Alert and oriented. Well appearing and in no distress. Head: Normocephalic and atraumatic.      Eyes: Conjunctivae are normal. PERRL. Normal extraocular movements      Ears: Canals clear. TMs intact bilaterally.   Nose: No congestion/rhinorrhea.   Mouth/Throat: Mucous membranes are moist. Patient with generally poor dental hygiene on exam. He has chronic dental caries and to the tooth decay noted. He has spontaneous drainage of purulent material from a small pustule at the upper jaw on the left side over  lying the primary molar.   Neck: Supple. No thyromegaly. Hematological/Lymphatic/Immunological: No cervical lymphadenopathy. Cardiovascular: Normal rate, regular rhythm.  Respiratory: Normal respiratory effort. No wheezes/rales/rhonchi. Gastrointestinal: Soft and nontender. No distention. Musculoskeletal: Nontender with normal range of motion in all extremities.  Neurologic:  Normal gait without ataxia. Normal speech and language. No gross focal neurologic deficits are appreciated. Skin:  Skin is warm, dry and intact. No rash noted. Psychiatric: Mood and affect are normal. Patient exhibits appropriate insight and judgment. ____________________________________________  INITIAL IMPRESSION / ASSESSMENT AND PLAN / ED COURSE  Patient with an acute dental abscess secondary to chronic dental caries. He'll be discharged with a prescription for clindamycin and a few Ultram to dose for pain relief. He is followed with a local dental providers on the list given. ____________________________________________  FINAL CLINICAL IMPRESSION(S) / ED DIAGNOSES  Final diagnoses:  Dental abscess  Dental caries      Lissa Hoard, PA-C 10/31/15 2341  Arnaldo Natal, MD 11/04/15 [phone]

## 2016-02-11 ENCOUNTER — Emergency Department
Admission: EM | Admit: 2016-02-11 | Discharge: 2016-02-11 | Disposition: A | Discharge status: Home / Self Care | Payer: Self-pay | Attending: Emergency Medicine | Admitting: Emergency Medicine

## 2016-02-11 ENCOUNTER — Encounter: Payer: Self-pay | Admitting: Emergency Medicine

## 2016-02-11 DIAGNOSIS — F17210 Nicotine dependence, cigarettes, uncomplicated: Secondary | ICD-10-CM | POA: Insufficient documentation

## 2016-02-11 DIAGNOSIS — R21 Rash and other nonspecific skin eruption: Secondary | ICD-10-CM | POA: Insufficient documentation

## 2016-02-11 NOTE — ED Notes (Signed)
Pt here with rash to right lower back, states it has been hurting and itching, his fiance being treated for active shingles at this time. Red welts noted on pts back,  not shingle like in appearance.

## 2016-02-11 NOTE — ED Notes (Signed)
Judeth CornfieldStephanie, RN/first nurse looked at pts rash and agrees it looks like bug bites and not shingles. Pt states he wants to go home and treat it first with cream and will come back if necessary.

## 2016-07-20 ENCOUNTER — Emergency Department
Admission: EM | Admit: 2016-07-20 | Discharge: 2016-07-20 | Disposition: A | Discharge status: Home / Self Care | Payer: Self-pay | Attending: Emergency Medicine | Admitting: Emergency Medicine

## 2016-07-20 ENCOUNTER — Encounter: Payer: Self-pay | Admitting: Physician Assistant

## 2016-07-20 DIAGNOSIS — Z79899 Other long term (current) drug therapy: Secondary | ICD-10-CM | POA: Insufficient documentation

## 2016-07-20 DIAGNOSIS — K029 Dental caries, unspecified: Secondary | ICD-10-CM | POA: Insufficient documentation

## 2016-07-20 DIAGNOSIS — K047 Periapical abscess without sinus: Secondary | ICD-10-CM | POA: Insufficient documentation

## 2016-07-20 DIAGNOSIS — Z791 Long term (current) use of non-steroidal anti-inflammatories (NSAID): Secondary | ICD-10-CM | POA: Insufficient documentation

## 2016-07-20 DIAGNOSIS — K0889 Other specified disorders of teeth and supporting structures: Secondary | ICD-10-CM

## 2016-07-20 DIAGNOSIS — F17210 Nicotine dependence, cigarettes, uncomplicated: Secondary | ICD-10-CM | POA: Insufficient documentation

## 2016-07-20 MED ORDER — HYDROCODONE-ACETAMINOPHEN 5-325 MG PO TABS
1.0000 | ORAL_TABLET | Freq: Once | ORAL | Status: AC
  Administered 2016-07-20: 1 via ORAL

## 2016-07-20 MED ORDER — CLINDAMYCIN HCL 150 MG PO CAPS
300.0000 mg | ORAL_CAPSULE | Freq: Once | ORAL | Status: AC
  Administered 2016-07-20: 300 mg via ORAL

## 2016-07-20 MED ORDER — NABUMETONE 750 MG PO TABS: 750.0000 mg | ORAL_TABLET | Freq: Two times a day (BID) | ORAL | 0 refills | Status: DC

## 2016-07-20 MED ORDER — HYDROCODONE-ACETAMINOPHEN 5-325 MG PO TABS
ORAL_TABLET | ORAL | Status: AC
  Filled 2016-07-20: qty 1

## 2016-07-20 MED ORDER — CLINDAMYCIN HCL 300 MG PO CAPS: 300.0000 mg | ORAL_CAPSULE | Freq: Three times a day (TID) | ORAL | 0 refills | Status: AC

## 2016-07-20 MED ORDER — CLINDAMYCIN HCL 150 MG PO CAPS
ORAL_CAPSULE | ORAL | Status: AC
  Filled 2016-07-20: qty 2

## 2016-07-20 MED ORDER — LIDOCAINE VISCOUS 2 % MT SOLN
15.0000 mL | Freq: Once | OROMUCOSAL | Status: AC
  Administered 2016-07-20: 15 mL via OROMUCOSAL
  Filled 2016-07-20: qty 15

## 2016-07-20 NOTE — ED Provider Notes (Signed)
[facility] Emergency Department Provider Note ____________________________________________  Time seen: 1620  I have reviewed the triage vital signs and the nursing notes.  HISTORY  Chief Complaint  Dental Pain  HPI Matthew Odonnell is a 30 y.o. male presents to the ED for evaluation of dental pain to the left upper jaw. The patient is known to [place] for previous visits for dental pain secondary to dental caries and chronic dental fractures. He denies any recent injury, accident, trauma. He notes pain to the upper left molars where he notes a chronically decayed molar down to the gumline and another molar with a ongoing fracture. He denies any spontaneous purulent discharge or drainage.  History reviewed. No pertinent past medical history.  There are no active problems to display for this patient.  Past Surgical History:  Procedure Laterality Date  . MANDIBLE SURGERY      Prior to Admission medications   Medication Sig Start Date End Date Taking? Authorizing Provider  clindamycin (CLEOCIN) 300 MG capsule Take 1 capsule (300 mg total) by mouth 3 (three) times Matthew. 07/20/16 07/30/16  Ensley Blas V Bacon Areeb Corron, PA-C  doxycycline (VIBRA-TABS) 100 MG tablet Take 1 tablet (100 mg total) by mouth 2 (two) times Matthew. 09/07/15   Delorise Royals Cuthriell, PA-C  HYDROcodone-acetaminophen (NORCO/VICODIN) 5-325 MG per tablet Take 1 tablet by mouth once as needed for pain.    Historical Provider, MD  ibuprofen (ADVIL,MOTRIN) 200 MG tablet Take 200 mg by mouth every 6 (six) hours as needed for pain.    Historical Provider, MD  ibuprofen (ADVIL,MOTRIN) 600 MG tablet Take 1 tablet (600 mg total) by mouth every 6 (six) hours as needed for pain. 07/19/13   Burgess Amor, PA-C  magic mouthwash w/lidocaine SOLN Take 5 mLs by mouth 4 (four) times Matthew. 09/07/15   Delorise Royals Cuthriell, PA-C  nabumetone (RELAFEN) 750 MG tablet Take 1 tablet (750 mg total) by mouth 2 (two) times Matthew. 07/20/16   Shane Badeaux  V Bacon Jala Dundon, PA-C  oxyCODONE-acetaminophen (ROXICET) 5-325 MG tablet Take 1 tablet by mouth every 6 (six) hours as needed for severe pain. 09/07/15   Delorise Royals Cuthriell, PA-C  traMADol (ULTRAM) 50 MG tablet Take 1 tablet (50 mg total) by mouth 2 (two) times Matthew. 10/31/15   Arling Cerone V Bacon Nicie Milan, PA-C   Allergies Penicillins and Sulfa antibiotics  No family history on file.  Social History Social History  Substance Use Topics  . Smoking status: Current Every Day Smoker    Packs/day: 1.00    Types: Cigarettes  . Smokeless tobacco: Never Used  . Alcohol use No   Review of Systems  Constitutional: Negative for fever. Eyes: Negative for visual changes. ENT: Negative for sore throat. Dental pain as above.  Cardiovascular: Negative for chest pain. Respiratory: Negative for shortness of breath. Gastrointestinal: Negative for abdominal pain, vomiting and diarrhea. ___________________________________________  PHYSICAL EXAM:  VITAL SIGNS: ED Triage Vitals  Enc Vitals Group     BP 07/20/16 1631 137/73     Pulse Rate 07/20/16 1631 91     Resp 07/20/16 1631 18     Temp 07/20/16 1631 98.6 F (37 C)     Temp Source 07/20/16 1631 Oral     SpO2 07/20/16 1631 99 %     Weight 07/20/16 1631 130 lb (59 kg)     Height 07/20/16 1631 5\' 11"  (1.803 m)     Head Circumference --      Peak Flow --  Pain Score 07/20/16 1632 10     Pain Loc --      Pain Edu? --      Excl. in GC? --    Constitutional: Alert and oriented. Well appearing and in no distress. Head: Normocephalic and atraumatic.      Eyes: Conjunctivae are normal. PERRL. Normal extraocular movements      Ears: Canals clear. TMs intact bilaterally.   Nose: No congestion/rhinorrhea.   Mouth/Throat: Mucous membranes are moist.Uvula is midline and tonsils are flat. No oral lesions are appreciated. 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 Specifically to the left upper molar the  patient is noted to have a chronically decayed tooth down to the gumline. No obvious buccal mucosal pointing, fluctuance, or abscesses appreciated.   Neck: Supple. No thyromegaly. Hematological/Lymphatic/Immunological: No cervical lymphadenopathy. Cardiovascular: Normal rate, regular rhythm.  Respiratory: Normal respiratory effort.  Neurologic:  Normal gait without ataxia. Normal speech and language. No gross focal neurologic deficits are appreciated. Skin:  Skin is warm, dry and intact. No rash noted. ____________________________________________  PROCEDURES  Clindamycin 300 mg PO Norco 5-325 mg PO 2% viscous lidocaine gargle & spit ____________________________________________  INITIAL IMPRESSION / ASSESSMENT AND PLAN / ED COURSE  Patient with a recurrent visit to the ED for dental pain. He is underlying poor dentition, dental caries, and chronic dental fractures. He refused dental block anesthesia. His request for narcotic pain medicine is declined at this time. He is discharged with a prescription for Relafen as well as clindamycin for potential infection management. [facility] with [facility] over the dental providers on the list given.  Clinical Course   ____________________________________________  FINAL CLINICAL IMPRESSION(S) / ED DIAGNOSES  Final diagnoses:  Dental caries  Pain, dental  Dental infection      Lissa HoardJenise V Bacon Josy Peaden, PA-C 07/20/16 1655    Phineas SemenGraydon Goodman, MD 07/20/16 [phone]

## 2016-07-20 NOTE — ED Notes (Signed)
Pt reports to ED w/ c/o dental pain x 1 week.  Pt sts that he has been seen here before for dental pain, but not given strong enough medication. Denies CP, SOB, n/v/d.  A/Ox4. NAD.

## 2016-07-20 NOTE — Discharge Instructions (Signed)
Take the antibiotic as directed. Rinse with warm, salty water after every meal. Take the anti-inflammatory pain medicine as needed. Follow-up with [facility] or one of the local dental clinics for definitive treatment.  OPTIONS FOR DENTAL FOLLOW UP CARE   Department of Health and Human Services - Local Safety Net Dental Clinics [email or web address]   [facility] [phone])  [facility] [phone])  [place] [phone] ext [phone])  [facility] [phone])  [facility] [phone]) This clinic caters to the indigent population and is on a lottery system. Location: [employer] [facility], [employer], [address], [place] Clinic Hours: Wednesdays from 6pm - 9pm, patients seen by a lottery system. For dates, call or go to [email or web address] Services: Cleanings, fillings and simple extractions. Payment Options: DENTAL WORK IS FREE OF CHARGE. Bring proof of income or support. Best way to get seen: Arrive at 5:15 pm - this is a lottery, NOT first come/first serve, so arriving earlier will not increase your chances of being seen.     [facility] [phone] Select option 1 for emergencies   Location: [facility], [place], [address] [place] Clinic Hours: No walk-ins accepted - call the day before to schedule an appointment. Check in times are 9:30 am and 1:30 pm. Services: Simple extractions, temporary fillings, pulpectomy/pulp debridement, uncomplicated abscess drainage. Payment Options: PAYMENT IS DUE AT THE TIME OF SERVICE.  Fee is usually $100-200, additional surgical procedures (e.g. abscess drainage) may be extra. Cash, checks, Visa/MasterCard accepted.  Can file Medicaid if patient is covered for dental - patient should call case worker to check. No discount  for [facility] patients. Best way to get seen: MUST call the day before and get onto the schedule. Can usually be seen the next 1-2 days. No walk-ins accepted.     [facility] [phone]   Location: [facility], [address] [place] Clinic Hours: M, W, Th, F 8am or 1:30pm, Tues 9a or 1:30 - first come/first served. Services: Simple extractions, temporary fillings, uncomplicated abscess drainage.  You do not need to be an [facility] resident. Payment Options: PAYMENT IS DUE AT THE TIME OF SERVICE. Dental insurance, otherwise sliding scale - bring proof of income or support. Depending on income and treatment needed, cost is usually $50-200. Best way to get seen: Arrive early as it is first come/first served.     [facility] [phone]   Location: [address] Clinic Hours: Mon-Thu 8a-5p Services: Most basic dental services including extractions and fillings. Payment Options: PAYMENT IS DUE AT THE TIME OF SERVICE. Sliding scale, up to 50% off - bring proof if income or support. Medicaid with dental option accepted. Best way to get seen: Call to schedule an appointment, can usually be seen within 2 weeks OR they will try to see walk-ins - show up at 8a or 2p (you may have to wait).     [facility] [phone] [place] RESIDENTS ONLY   Location: [facility], [address], [place], [place] [address] Clinic Hours: By appointment only. Monday - Thursday 8am-5pm, Friday 8am-12pm Services: Cleanings, fillings, extractions. Payment Options: PAYMENT IS DUE AT THE TIME OF SERVICE. Cash, Visa or MasterCard. Sliding scale - $30 minimum per service. Best way to get seen: Come in to office, complete packet and make an appointment - need proof of income or  support monies for each household member and proof of [facility] [place] residence. Usually  takes about a month to get in.     [facility] [phone]   Location: [address] [facility] Hours: Walk-in Urgent Care Dental Services are offered Monday-Friday mornings only. The numbers of emergencies accepted daily is limited to the number of providers available. Maximum 15 - Mondays, Wednesdays & Thursdays Maximum 10 - Tuesdays & Fridays Services: You do not need to be a [place] resident to be seen for a dental emergency. Emergencies are defined as pain, swelling, abnormal bleeding, or dental trauma. Walkins will receive x-rays if needed. NOTE: Dental cleaning is not an emergency. Payment Options: PAYMENT IS DUE AT THE TIME OF SERVICE. Minimum co-pay is $40.00 for uninsured patients. Minimum co-pay is $3.00 for Medicaid with dental coverage. Dental Insurance is accepted and must be presented at time of visit. Medicare does not cover dental. Forms of payment: Cash, credit card, checks. Best way to get seen: If not previously registered with the clinic, walk-in dental registration begins at 7:15 am and is on a first come/first serve basis. If previously registered with the clinic, call to make an appointment.     [facility] [phone] [place] RESIDENTS ONLY   Location: [address], [place], [place] Clinic Hours: Mon-Thu 10a-2p Services: Extractions only! Payment Options: FREE (donations accepted) - bring proof of income or support Best way to get seen: Call and schedule an appointment OR come at 8am on the 1st Monday of every month (except for holidays) when it is first come/first served.     [facility] [phone]   Location: [address], [place] Clinic Hours: Friday mornings Services, Payment Options, Best way to get seen: Call for info

## 2016-08-03 ENCOUNTER — Encounter: Payer: Self-pay | Admitting: Emergency Medicine

## 2016-08-03 ENCOUNTER — Emergency Department: Payer: No Typology Code available for payment source

## 2016-08-03 ENCOUNTER — Emergency Department
Admission: EM | Admit: 2016-08-03 | Discharge: 2016-08-03 | Disposition: A | Discharge status: Home / Self Care | Payer: No Typology Code available for payment source | Attending: Emergency Medicine | Admitting: Emergency Medicine

## 2016-08-03 DIAGNOSIS — S93401A Sprain of unspecified ligament of right ankle, initial encounter: Secondary | ICD-10-CM | POA: Diagnosis not present

## 2016-08-03 DIAGNOSIS — Y999 Unspecified external cause status: Secondary | ICD-10-CM | POA: Diagnosis not present

## 2016-08-03 DIAGNOSIS — Y9355 Activity, bike riding: Secondary | ICD-10-CM | POA: Diagnosis not present

## 2016-08-03 DIAGNOSIS — Y9289 Other specified places as the place of occurrence of the external cause: Secondary | ICD-10-CM | POA: Insufficient documentation

## 2016-08-03 DIAGNOSIS — S81811A Laceration without foreign body, right lower leg, initial encounter: Secondary | ICD-10-CM

## 2016-08-03 DIAGNOSIS — F17210 Nicotine dependence, cigarettes, uncomplicated: Secondary | ICD-10-CM | POA: Insufficient documentation

## 2016-08-03 MED ORDER — LIDOCAINE-EPINEPHRINE (PF) 1 %-1:200000 IJ SOLN
INTRAMUSCULAR | Status: AC
  Administered 2016-08-03: 30 mL
  Filled 2016-08-03: qty 30

## 2016-08-03 MED ORDER — IBUPROFEN 600 MG PO TABS
600.0000 mg | ORAL_TABLET | Freq: Once | ORAL | Status: AC
  Administered 2016-08-03: 600 mg via ORAL
  Filled 2016-08-03: qty 1

## 2016-08-03 MED ORDER — LIDOCAINE-EPINEPHRINE 2 %-1:100000 IJ SOLN
20.0000 mL | Freq: Once | INTRAMUSCULAR | Status: DC
  Filled 2016-08-03: qty 20

## 2016-08-03 MED ORDER — LIDOCAINE-EPINEPHRINE 2 %-1:100000 IJ SOLN
30.0000 mL | Freq: Once | INTRAMUSCULAR | Status: DC
  Filled 2016-08-03: qty 30

## 2016-08-03 MED ORDER — NAPROXEN 500 MG PO TABS: 500.0000 mg | ORAL_TABLET | Freq: Two times a day (BID) | ORAL | 2 refills | Status: DC
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
## 2016-11-01 ENCOUNTER — Emergency Department: Payer: Self-pay

## 2016-11-01 ENCOUNTER — Encounter: Payer: Self-pay | Admitting: Emergency Medicine

## 2016-11-01 ENCOUNTER — Emergency Department
Admission: EM | Admit: 2016-11-01 | Discharge: 2016-11-01 | Disposition: A | Discharge status: Home / Self Care | Payer: Self-pay | Attending: Emergency Medicine | Admitting: Emergency Medicine

## 2016-11-01 DIAGNOSIS — Z79899 Other long term (current) drug therapy: Secondary | ICD-10-CM | POA: Insufficient documentation

## 2016-11-01 DIAGNOSIS — W208XXA Other cause of strike by thrown, projected or falling object, initial encounter: Secondary | ICD-10-CM | POA: Insufficient documentation

## 2016-11-01 DIAGNOSIS — S62521A Displaced fracture of distal phalanx of right thumb, initial encounter for closed fracture: Secondary | ICD-10-CM | POA: Insufficient documentation

## 2016-11-01 DIAGNOSIS — Y999 Unspecified external cause status: Secondary | ICD-10-CM | POA: Insufficient documentation

## 2016-11-01 DIAGNOSIS — Y939 Activity, unspecified: Secondary | ICD-10-CM | POA: Insufficient documentation

## 2016-11-01 DIAGNOSIS — Y929 Unspecified place or not applicable: Secondary | ICD-10-CM | POA: Insufficient documentation

## 2016-11-01 DIAGNOSIS — S62639A Displaced fracture of distal phalanx of unspecified finger, initial encounter for closed fracture: Secondary | ICD-10-CM

## 2016-11-01 DIAGNOSIS — F17210 Nicotine dependence, cigarettes, uncomplicated: Secondary | ICD-10-CM | POA: Insufficient documentation

## 2016-11-01 MED ORDER — OXYCODONE-ACETAMINOPHEN 5-325 MG PO TABS: 1.0000 | ORAL_TABLET | Freq: Four times a day (QID) | ORAL | 0 refills | Status: AC | PRN

## 2016-11-01 MED ORDER — OXYCODONE-ACETAMINOPHEN 5-325 MG PO TABS
1.0000 | ORAL_TABLET | Freq: Once | ORAL | Status: AC
  Administered 2016-11-01: 1 via ORAL
  Filled 2016-11-01: qty 1

## 2016-11-01 MED ORDER — TRAMADOL HCL 50 MG PO TABS
50.0000 mg | ORAL_TABLET | Freq: Once | ORAL | Status: AC
  Administered 2016-11-01: 50 mg via ORAL
  Filled 2016-11-01: qty 1
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
## 2016-11-01 NOTE — ED Provider Notes (Signed)
[facility] Emergency Department Provider Note  ____________________________________________  Time seen: Approximately 5:27 PM  I have reviewed the triage vital signs and the nursing notes.   HISTORY  Chief Complaint Hand Pain    HPI Dutch QuintJeffrey E Stene is a 30 y.o. male that presents to the emergency department after a metal beam landed on right thumb. Thumb is swelling. Patient states his thumb feels throbbing and has numbness and tingling. Patient is having difficulty moving thumb. Patient has not taken anything for pain. No additional injuries.   History reviewed. No pertinent past medical history.  There are no active problems to display for this patient.   Past Surgical History:  Procedure Laterality Date  . MANDIBLE SURGERY      Prior to Admission medications   Medication Sig Start Date End Date Taking? Authorizing Provider  doxycycline (VIBRA-TABS) 100 MG tablet Take 1 tablet (100 mg total) by mouth 2 (two) times daily. 09/07/15   Delorise RoyalsJonathan D Cuthriell, PA-C  HYDROcodone-acetaminophen (NORCO/VICODIN) 5-325 MG per tablet Take 1 tablet by mouth once as needed for pain.    Historical Provider, MD  ibuprofen (ADVIL,MOTRIN) 200 MG tablet Take 200 mg by mouth every 6 (six) hours as needed for pain.    Historical Provider, MD  ibuprofen (ADVIL,MOTRIN) 600 MG tablet Take 1 tablet (600 mg total) by mouth every 6 (six) hours as needed for pain. 07/19/13   Burgess AmorJulie Idol, PA-C  magic mouthwash w/lidocaine SOLN Take 5 mLs by mouth 4 (four) times daily. 09/07/15   Delorise RoyalsJonathan D Cuthriell, PA-C  nabumetone (RELAFEN) 750 MG tablet Take 1 tablet (750 mg total) by mouth 2 (two) times daily. 07/20/16   Jenise V Bacon Menshew, PA-C  naproxen (NAPROSYN) 500 MG tablet Take 1 tablet (500 mg total) by mouth 2 (two) times daily with a meal. 08/03/16   Jene Everyobert Kinner, MD  oxyCODONE-acetaminophen (ROXICET) 5-325 MG tablet Take 1 tablet by mouth every 6 (six) hours as needed. 11/01/16 11/01/17   Enid DerryAshley Brysan Mcevoy, PA-C  traMADol (ULTRAM) 50 MG tablet Take 1 tablet (50 mg total) by mouth 2 (two) times daily. 10/31/15   Jenise V Bacon Menshew, PA-C    Allergies Penicillins and Sulfa antibiotics  History reviewed. No pertinent family history.  Social History Social History  Substance Use Topics  . Smoking status: Current Every Day Smoker    Packs/day: 1.00    Types: Cigarettes  . Smokeless tobacco: Never Used  . Alcohol use No     Review of Systems  Constitutional: No fever/chills Cardiovascular: No chest pain. Respiratory: No cough. No SOB. Gastrointestinal: No abdominal pain.  No nausea, no vomiting.  Skin: Negative for rash, abrasions,  Neurological: Negative for headaches   ____________________________________________   PHYSICAL EXAM:  VITAL SIGNS: ED Triage Vitals  Enc Vitals Group     BP 11/01/16 1701 131/87     Pulse Rate 11/01/16 1701 92     Resp --      Temp 11/01/16 1701 97.9 F (36.6 C)     Temp Source 11/01/16 1701 Oral     SpO2 11/01/16 1701 100 %     Weight 11/01/16 1656 140 lb (63.5 kg)     Height 11/01/16 1656 5\' 10"  (1.778 m)     Head Circumference --      Peak Flow --      Pain Score 11/01/16 1656 10     Pain Loc --      Pain Edu? --  Excl. in GC? --      Constitutional: Alert and oriented. Well appearing and in no acute distress. Eyes: Conjunctivae are normal. PERRL. EOMI. Head: Atraumatic. ENT:      Ears:      Nose: No congestion/rhinnorhea.      Mouth/Throat: Mucous membranes are moist.  Neck: No stridor.   Cardiovascular: Normal rate, regular rhythm. Normal S1 and S2.  Good peripheral circulation. 2+ radial pulses. Respiratory: Normal respiratory effort without tachypnea or retractions. Lungs CTAB. Good air entry to the bases with no decreased or absent breath sounds. Musculoskeletal: Moderate swelling of right thumb. Limited range of motion. Neurologic:  Normal speech and language. No gross focal neurologic deficits are  appreciated. Sensation of thumb intact. Skin:  Skin is warm, dry. Blood under nail.  Psychiatric: Mood and affect are normal. Speech and behavior are normal. Patient exhibits appropriate insight and judgement.   ____________________________________________   LABS (all labs ordered are listed, but only abnormal results are displayed)  Labs Reviewed - No data to display ____________________________________________  EKG   ____________________________________________  RADIOLOGY Lexine BatonI, Aimee Heldman, personally viewed and evaluated these images (plain radiographs) as part of my medical decision making, as well as reviewing the written report by the radiologist.  Dg Finger Thumb Right  Result Date: 11/01/2016 CLINICAL DATA:  Pain after trauma. EXAM: RIGHT THUMB 2+V COMPARISON:  None. FINDINGS: There is a fracture through the tuft of the distal first phalanx. No foreign bodies. No other acute abnormalities. IMPRESSION: Mildly displaced fracture through the distal tuft of the thumb. Electronically Signed   By: Gerome Samavid  Williams III M.D   On: 11/01/2016 18:17    ____________________________________________    PROCEDURES  Procedure(s) performed:    Procedures    Medications  oxyCODONE-acetaminophen (PERCOCET/ROXICET) 5-325 MG per tablet 1 tablet (1 tablet Oral Given 11/01/16 1744)  traMADol (ULTRAM) tablet 50 mg (50 mg Oral Given 11/01/16 1902)     ____________________________________________   INITIAL IMPRESSION / ASSESSMENT AND PLAN / ED COURSE  Pertinent labs & imaging results that were available during my care of the patient were reviewed by me and considered in my medical decision making (see chart for details).  Review of the [place] CSRS was performed in accordance of the [facility] prior to dispensing any controlled drugs.  Clinical Course     Patient's diagnosis is consistent with distal tuft fracture. Trephination was performed in ED. Thumb was wrapped and splint was applied  .Patient will be discharged home with prescriptions for Percocet. Patient is to follow up with ortho as directed. Patient is given ED precautions to return to the ED for any worsening or new symptoms.  ____________________________________________  FINAL CLINICAL IMPRESSION(S) / ED DIAGNOSES  Final diagnoses:  Closed fracture of tuft of distal phalanx of finger      NEW MEDICATIONS STARTED DURING THIS VISIT:  New Prescriptions   OXYCODONE-ACETAMINOPHEN (ROXICET) 5-325 MG TABLET    Take 1 tablet by mouth every 6 (six) hours as needed.        This chart was dictated using voice recognition software/Dragon. Despite best efforts to proofread, errors can occur which can change the meaning. Any change was purely unintentional.    Enid DerryAshley Sheralyn Pinegar, PA-C 11/01/16 1906    Jeanmarie PlantJames A McShane, MD 11/03/16 [phone]

## 2016-11-03 ENCOUNTER — Emergency Department
Admission: EM | Admit: 2016-11-03 | Discharge: 2016-11-03 | Disposition: A | Discharge status: Home / Self Care | Payer: Self-pay | Attending: Emergency Medicine | Admitting: Emergency Medicine

## 2016-11-03 DIAGNOSIS — S62521A Displaced fracture of distal phalanx of right thumb, initial encounter for closed fracture: Secondary | ICD-10-CM

## 2016-11-03 DIAGNOSIS — Z79899 Other long term (current) drug therapy: Secondary | ICD-10-CM | POA: Insufficient documentation

## 2016-11-03 DIAGNOSIS — X58XXXD Exposure to other specified factors, subsequent encounter: Secondary | ICD-10-CM | POA: Insufficient documentation

## 2016-11-03 DIAGNOSIS — F17210 Nicotine dependence, cigarettes, uncomplicated: Secondary | ICD-10-CM | POA: Insufficient documentation

## 2016-11-03 DIAGNOSIS — S62521D Displaced fracture of distal phalanx of right thumb, subsequent encounter for fracture with routine healing: Secondary | ICD-10-CM | POA: Insufficient documentation

## 2016-11-03 MED ORDER — OXYCODONE-ACETAMINOPHEN 5-325 MG PO TABS
2.0000 | ORAL_TABLET | Freq: Once | ORAL | Status: AC
  Administered 2016-11-03: 2 via ORAL
  Filled 2016-11-03: qty 2

## 2016-11-03 MED ORDER — OXYCODONE-ACETAMINOPHEN 7.5-325 MG PO TABS: 1.0000 | ORAL_TABLET | ORAL | 0 refills | Status: AC | PRN

## 2016-11-03 NOTE — Discharge Instructions (Signed)
Wear splint and take medication as directed.

## 2016-11-03 NOTE — ED Triage Notes (Signed)
Pt to ED c/o finger injury to right thumb. Per pt he broke tip of finger on Saturday, and can not afford to go to the follow up appointment. Pt finger swollen and nail bed discolored in no cute distress at this time.

## 2016-11-03 NOTE — ED Provider Notes (Signed)
[facility] Emergency Department Provider Note   ____________________________________________   First MD Initiated Contact with Patient 11/03/16 1608     (approximate)  I have reviewed the triage vital signs and the nursing notes.   HISTORY  Chief Complaint Finger Injury    HPI Matthew Odonnell is a 30 y.o. male patient complaining of pain and edema to right thumb secondary to a toft fracture which occurred 2 days ago. Patient state he was told to follow-up with orthopedics but unable to follow-up secondary to not having the co-pay required. Patient [facility] has appointment to see them next week. Patient states he was only given 3 days of pain medication because it was assumed he was seen orthopedics today.Patient rates his pain as a 10 over 10. Patient describes the pain as "throbbing".   History reviewed. No pertinent past medical history.  There are no active problems to display for this patient.   Past Surgical History:  Procedure Laterality Date  . MANDIBLE SURGERY      Prior to Admission medications   Medication Sig Start Date End Date Taking? Authorizing Provider  doxycycline (VIBRA-TABS) 100 MG tablet Take 1 tablet (100 mg total) by mouth 2 (two) times daily. 09/07/15   Delorise RoyalsJonathan D Cuthriell, PA-C  HYDROcodone-acetaminophen (NORCO/VICODIN) 5-325 MG per tablet Take 1 tablet by mouth once as needed for pain.    Historical Provider, MD  ibuprofen (ADVIL,MOTRIN) 200 MG tablet Take 200 mg by mouth every 6 (six) hours as needed for pain.    Historical Provider, MD  ibuprofen (ADVIL,MOTRIN) 600 MG tablet Take 1 tablet (600 mg total) by mouth every 6 (six) hours as needed for pain. 07/19/13   Burgess AmorJulie Idol, PA-C  magic mouthwash w/lidocaine SOLN Take 5 mLs by mouth 4 (four) times daily. 09/07/15   Delorise RoyalsJonathan D Cuthriell, PA-C  nabumetone (RELAFEN) 750 MG tablet Take 1 tablet (750 mg total) by mouth 2 (two) times daily.  07/20/16   Jenise V Bacon Menshew, PA-C  naproxen (NAPROSYN) 500 MG tablet Take 1 tablet (500 mg total) by mouth 2 (two) times daily with a meal. 08/03/16   Jene Everyobert Kinner, MD  oxyCODONE-acetaminophen (PERCOCET) 7.5-325 MG tablet Take 1 tablet by mouth every 4 (four) hours as needed for severe pain. 11/03/16 11/03/17  Joni Reiningonald K Jerold Yoss, PA-C  oxyCODONE-acetaminophen (ROXICET) 5-325 MG tablet Take 1 tablet by mouth every 6 (six) hours as needed. 11/01/16 11/01/17  Enid DerryAshley Wagner, PA-C  traMADol (ULTRAM) 50 MG tablet Take 1 tablet (50 mg total) by mouth 2 (two) times daily. 10/31/15   Jenise V Bacon Menshew, PA-C    Allergies Penicillins and Sulfa antibiotics  History reviewed. No pertinent family history.  Social History Social History  Substance Use Topics  . Smoking status: Current Every Day Smoker    Packs/day: 1.00    Types: Cigarettes  . Smokeless tobacco: Never Used  . Alcohol use No    Review of Systems Constitutional: No fever/chills Eyes: No visual changes. ENT: No sore throat. Cardiovascular: Denies chest pain. Respiratory: Denies shortness of breath. Gastrointestinal: No abdominal pain.  No nausea, no vomiting.  No diarrhea.  No constipation. Genitourinary: Negative for dysuria. Musculoskeletal:Right thumb pain. Skin: Negative for rash. Neurological: Negative for headaches, focal weakness or numbness.    ____________________________________________   PHYSICAL EXAM:  VITAL SIGNS: ED Triage Vitals [11/03/16 1614]  Enc Vitals Group     BP      Pulse  Resp      Temp      Temp src      SpO2      Weight      Height      Head Circumference      Peak Flow      Pain Score 10     Pain Loc      Pain Edu?      Excl. in GC?     Constitutional: Alert and oriented. Well appearing and in no acute distress. Eyes: Conjunctivae are normal. PERRL. EOMI. Head: Atraumatic. Nose: No congestion/rhinnorhea. Mouth/Throat: Mucous membranes are moist.  Oropharynx  non-erythematous. Neck: No stridor.  No cervical spine tenderness to palpation. Hematological/Lymphatic/Immunilogical: No cervical lymphadenopathy. Cardiovascular: Normal rate, regular rhythm. Grossly normal heart sounds.  Good peripheral circulation. Respiratory: Normal respiratory effort.  No retractions. Lungs CTAB. Gastrointestinal: Soft and nontender. No distention. No abdominal bruits. No CVA tenderness. Musculoskeletal: No obvious deformity . Moderate edema and ecchymosis. Patient finger nail shows relief from subungual hematoma. Patient has full nuchal range of motion with flexion extension of the distal phalange right thumb.  Neurologic:  Normal speech and language. No gross focal neurologic deficits are appreciated. No gait instability. Skin:  Skin is warm, dry and intact. No rash noted. Ecchymosis palmar aspect of right thumb. Psychiatric: Mood and affect are normal. Speech and behavior are normal.  ____________________________________________   LABS (all labs ordered are listed, but only abnormal results are displayed)  Labs Reviewed - No data to display ____________________________________________  EKG   ____________________________________________  RADIOLOGY  Reviewed x-ray showing the displaced fracture of the right thumb. ____________________________________________   PROCEDURES  Procedure(s) performed: None  Procedures  Critical Care performed: No  ____________________________________________   INITIAL IMPRESSION / ASSESSMENT AND PLAN / ED COURSE  Pertinent labs & imaging results that were available during my care of the patient were reviewed by me and considered in my medical decision making (see chart for details).  Pain secondary to a fractured right thumb. Patient given discharge care instructions. Patient advised that he may follow-up with scheduled [facility] orthopedics. Patient given a prescription for Percocets and advise continue taking naproxen as  directed.  Clinical Course      ____________________________________________   FINAL CLINICAL IMPRESSION(S) / ED DIAGNOSES  Final diagnoses:  Closed fracture of tuft of distal phalanx of right thumb      NEW MEDICATIONS STARTED DURING THIS VISIT:  New Prescriptions   OXYCODONE-ACETAMINOPHEN (PERCOCET) 7.5-325 MG TABLET    Take 1 tablet by mouth every 4 (four) hours as needed for severe pain.     Note:  This document was prepared using Dragon voice recognition software and may include unintentional dictation errors.    Joni ReiningRonald K Pauleen Goleman, PA-C 11/03/16 1633    Sharman CheekPhillip Stafford, MD 11/04/16 [phone]

## 2016-11-18 ENCOUNTER — Encounter: Payer: Self-pay | Admitting: *Deleted

## 2016-11-18 ENCOUNTER — Emergency Department
Admission: EM | Admit: 2016-11-18 | Discharge: 2016-11-18 | Disposition: A | Discharge status: Home / Self Care | Payer: Self-pay | Attending: Emergency Medicine | Admitting: Emergency Medicine

## 2016-11-18 DIAGNOSIS — W231XXA Caught, crushed, jammed, or pinched between stationary objects, initial encounter: Secondary | ICD-10-CM | POA: Insufficient documentation

## 2016-11-18 DIAGNOSIS — Y999 Unspecified external cause status: Secondary | ICD-10-CM | POA: Insufficient documentation

## 2016-11-18 DIAGNOSIS — IMO0001 Reserved for inherently not codable concepts without codable children: Secondary | ICD-10-CM

## 2016-11-18 DIAGNOSIS — Y939 Activity, unspecified: Secondary | ICD-10-CM | POA: Insufficient documentation

## 2016-11-18 DIAGNOSIS — Y929 Unspecified place or not applicable: Secondary | ICD-10-CM | POA: Insufficient documentation

## 2016-11-18 DIAGNOSIS — F17210 Nicotine dependence, cigarettes, uncomplicated: Secondary | ICD-10-CM | POA: Insufficient documentation

## 2016-11-18 DIAGNOSIS — Z79899 Other long term (current) drug therapy: Secondary | ICD-10-CM | POA: Insufficient documentation

## 2016-11-18 DIAGNOSIS — S61309A Unspecified open wound of unspecified finger with damage to nail, initial encounter: Secondary | ICD-10-CM

## 2016-11-18 DIAGNOSIS — Z791 Long term (current) use of non-steroidal anti-inflammatories (NSAID): Secondary | ICD-10-CM | POA: Insufficient documentation

## 2016-11-18 DIAGNOSIS — S61101A Unspecified open wound of right thumb with damage to nail, initial encounter: Secondary | ICD-10-CM | POA: Insufficient documentation

## 2016-11-18 MED ORDER — OXYCODONE-ACETAMINOPHEN 5-325 MG PO TABS: 1.0000 | ORAL_TABLET | Freq: Four times a day (QID) | ORAL | 0 refills | Status: DC | PRN

## 2016-11-18 NOTE — ED Triage Notes (Signed)
States right thumb pain, states he broke his thumb and then got the nail caught on a blanket and ripped the finger nail

## 2016-11-18 NOTE — ED Provider Notes (Signed)
[facility] Emergency Department Provider Note   ____________________________________________   First MD Initiated Contact with Patient 11/18/16 1444     (approximate)  I have reviewed the triage vital signs and the nursing notes.   HISTORY  Chief Complaint Hand Pain    HPI Matthew Odonnell is a 31 y.o. male patient status post 2 weekstoe fracture to the right thumb. Patient has not follow-up orthopedics as directed. Patient said do not have money to pay the co-pay required. Patient presents today with partial nail avulsion secondary to being "on a blanket. Patient rates pain as a 7/10. No palliative measures complaint.   History reviewed. No pertinent past medical history.  There are no active problems to display for this patient.   Past Surgical History:  Procedure Laterality Date  . MANDIBLE SURGERY      Prior to Admission medications   Medication Sig Start Date End Date Taking? Authorizing Provider  doxycycline (VIBRA-TABS) 100 MG tablet Take 1 tablet (100 mg total) by mouth 2 (two) times daily. 09/07/15   Delorise Royals Cuthriell, PA-C  HYDROcodone-acetaminophen (NORCO/VICODIN) 5-325 MG per tablet Take 1 tablet by mouth once as needed for pain.    Historical Provider, MD  ibuprofen (ADVIL,MOTRIN) 200 MG tablet Take 200 mg by mouth every 6 (six) hours as needed for pain.    Historical Provider, MD  ibuprofen (ADVIL,MOTRIN) 600 MG tablet Take 1 tablet (600 mg total) by mouth every 6 (six) hours as needed for pain. 07/19/13   Burgess Amor, PA-C  magic mouthwash w/lidocaine SOLN Take 5 mLs by mouth 4 (four) times daily. 09/07/15   Delorise Royals Cuthriell, PA-C  nabumetone (RELAFEN) 750 MG tablet Take 1 tablet (750 mg total) by mouth 2 (two) times daily. 07/20/16   Jenise V Bacon Menshew, PA-C  naproxen (NAPROSYN) 500 MG tablet Take 1 tablet (500 mg total) by mouth 2 (two) times daily with a meal. 08/03/16   Jene Every, MD  oxyCODONE-acetaminophen (PERCOCET)  7.5-325 MG tablet Take 1 tablet by mouth every 4 (four) hours as needed for severe pain. 11/03/16 11/03/17  Joni Reining, PA-C  oxyCODONE-acetaminophen (ROXICET) 5-325 MG tablet Take 1 tablet by mouth every 6 (six) hours as needed. 11/01/16 11/01/17  Enid Derry, PA-C  oxyCODONE-acetaminophen (ROXICET) 5-325 MG tablet Take 1 tablet by mouth every 6 (six) hours as needed for moderate pain. 11/18/16   Joni Reining, PA-C  traMADol (ULTRAM) 50 MG tablet Take 1 tablet (50 mg total) by mouth 2 (two) times daily. 10/31/15   Jenise V Bacon Menshew, PA-C    Allergies Penicillins and Sulfa antibiotics  History reviewed. No pertinent family history.  Social History Social History  Substance Use Topics  . Smoking status: Current Every Day Smoker    Packs/day: 1.00    Types: Cigarettes  . Smokeless tobacco: Never Used  . Alcohol use No    Review of Systems Constitutional: No fever/chills Eyes: No visual changes. ENT: No sore throat. Cardiovascular: Denies chest pain. Respiratory: Denies shortness of breath. Gastrointestinal: No abdominal pain.  No nausea, no vomiting.  No diarrhea.  No constipation. Genitourinary: Negative for dysuria. Musculoskeletal: Recent tuft fracture right thumb Skin: Negative for rash. Neurological: Negative for headaches, focal weakness or numbness. Allergic/Immunilogical: See medication list  ____________________________________________   PHYSICAL EXAM:  VITAL SIGNS: ED Triage Vitals  Enc Vitals Group     BP 11/18/16 1344 (!) 116/41     Pulse Rate 11/18/16 1344 87  Resp 11/18/16 1344 18     Temp 11/18/16 1344 98.3 F (36.8 C)     Temp Source 11/18/16 1344 Oral     SpO2 11/18/16 1344 100 %     Weight 11/18/16 1345 140 lb (63.5 kg)     Height 11/18/16 1345 5\' 10"  (1.778 m)     Head Circumference --      Peak Flow --      Pain Score 11/18/16 1345 7     Pain Loc --      Pain Edu? --      Excl. in GC? --     Constitutional: Alert and oriented.  Well appearing and in no acute distress. Eyes: Conjunctivae are normal. PERRL. EOMI. Head: Atraumatic. Nose: No congestion/rhinnorhea. Mouth/Throat: Mucous membranes are moist.  Oropharynx non-erythematous. Neck: No stridor.  No cervical spine tenderness to palpation. Hematological/Lymphatic/Immunilogical: No cervical lymphadenopathy. Cardiovascular: Normal rate, regular rhythm. Grossly normal heart sounds.  Good peripheral circulation. Respiratory: Normal respiratory effort.  No retractions. Lungs CTAB. Gastrointestinal: Soft and nontender. No distention. No abdominal bruits. No CVA tenderness. Musculoskeletal: No lower extremity tenderness nor edema.  No joint effusions. Neurologic:  Normal speech and language. No gross focal neurologic deficits are appreciated. No gait instability. Skin:  Skin is warm, dry and intact. No rash noted.Partial nail avulsion of the right thumb. Psychiatric: Mood and affect are normal. Speech and behavior are normal.  ____________________________________________   LABS (all labs ordered are listed, but only abnormal results are displayed)  Labs Reviewed - No data to display ____________________________________________  EKG   ____________________________________________  RADIOLOGY  Reviewed x-ray taken on 11/01/2006. ____________________________________________   PROCEDURES  Procedure(s) performed: None  Procedures  Critical Care performed: No  ____________________________________________   INITIAL IMPRESSION / ASSESSMENT AND PLAN / ED COURSE  Pertinent labs & imaging results that were available during my care of the patient were reviewed by me and considered in my medical decision making (see chart for details).  Partial nail avulsion right thumb. Tuft fracture right thumb status post 2 weeks. Patient given discharge Instructions. Patient given 3 days a Percocets and advised to wear splint until the nail completely comes  off.  Clinical Course   Partial nail avulsion. Discussed patient nail will probably growing 4-6 weeks.   ____________________________________________   FINAL CLINICAL IMPRESSION(S) / ED DIAGNOSES  Final diagnoses:  Avulsion of nail plate, initial encounter      NEW MEDICATIONS STARTED DURING THIS VISIT:  New Prescriptions   OXYCODONE-ACETAMINOPHEN (ROXICET) 5-325 MG TABLET    Take 1 tablet by mouth every 6 (six) hours as needed for moderate pain.     Note:  This document was prepared using Dragon voice recognition software and may include unintentional dictation errors.    Joni Reiningonald K Metta Koranda, PA-C 11/18/16 1457    Emily FilbertJonathan E Williams, MD 11/18/16 [phone]

## 2016-11-18 NOTE — Discharge Instructions (Signed)
Wear splint 2-3 weeks as needed.

## 2017-05-19 DIAGNOSIS — S022XXA Fracture of nasal bones, initial encounter for closed fracture: Secondary | ICD-10-CM | POA: Insufficient documentation

## 2017-05-19 DIAGNOSIS — Y939 Activity, unspecified: Secondary | ICD-10-CM | POA: Insufficient documentation

## 2017-05-19 DIAGNOSIS — Y929 Unspecified place or not applicable: Secondary | ICD-10-CM | POA: Insufficient documentation

## 2017-05-19 DIAGNOSIS — Z79899 Other long term (current) drug therapy: Secondary | ICD-10-CM | POA: Insufficient documentation

## 2017-05-19 DIAGNOSIS — Y999 Unspecified external cause status: Secondary | ICD-10-CM | POA: Insufficient documentation

## 2017-05-19 DIAGNOSIS — F17210 Nicotine dependence, cigarettes, uncomplicated: Secondary | ICD-10-CM | POA: Insufficient documentation

## 2017-05-20 ENCOUNTER — Emergency Department
Admission: EM | Admit: 2017-05-20 | Discharge: 2017-05-20 | Disposition: A | Discharge status: Home / Self Care | Payer: Self-pay | Attending: Emergency Medicine | Admitting: Emergency Medicine

## 2017-05-20 ENCOUNTER — Emergency Department: Payer: Self-pay

## 2017-05-20 ENCOUNTER — Encounter: Payer: Self-pay | Admitting: *Deleted

## 2017-05-20 DIAGNOSIS — S022XXA Fracture of nasal bones, initial encounter for closed fracture: Secondary | ICD-10-CM

## 2017-05-20 MED ORDER — OXYCODONE-ACETAMINOPHEN 5-325 MG PO TABS
1.0000 | ORAL_TABLET | ORAL | 0 refills | Status: DC | PRN
Start: 2017-05-20 — End: 2018-01-13

## 2017-05-20 MED ORDER — OXYCODONE-ACETAMINOPHEN 5-325 MG PO TABS
1.0000 | ORAL_TABLET | Freq: Once | ORAL | Status: AC
  Administered 2017-05-20: 1 via ORAL
  Filled 2017-05-20: qty 1

## 2017-05-20 NOTE — ED Notes (Signed)

## 2017-05-20 NOTE — ED Notes (Signed)
Pt going to CT
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
## 2017-05-22 ENCOUNTER — Encounter: Payer: Self-pay | Admitting: Medical Oncology

## 2017-05-22 ENCOUNTER — Emergency Department
Admission: EM | Admit: 2017-05-22 | Discharge: 2017-05-22 | Disposition: A | Discharge status: Home / Self Care | Payer: Self-pay | Attending: Emergency Medicine | Admitting: Emergency Medicine

## 2017-05-22 DIAGNOSIS — Z760 Encounter for issue of repeat prescription: Secondary | ICD-10-CM | POA: Insufficient documentation

## 2017-05-22 DIAGNOSIS — Z791 Long term (current) use of non-steroidal anti-inflammatories (NSAID): Secondary | ICD-10-CM | POA: Insufficient documentation

## 2017-05-22 DIAGNOSIS — Z79899 Other long term (current) drug therapy: Secondary | ICD-10-CM | POA: Insufficient documentation

## 2017-05-22 DIAGNOSIS — F17210 Nicotine dependence, cigarettes, uncomplicated: Secondary | ICD-10-CM | POA: Insufficient documentation

## 2017-05-22 MED ORDER — MELOXICAM 7.5 MG PO TABS: 7.5000 mg | ORAL_TABLET | Freq: Every day | ORAL | 1 refills | Status: AC
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
## 2017-05-22 NOTE — ED Notes (Signed)
RN in room to discharge pt.  Pt gone. Papers placed in shred box.
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
## 2018-01-13 ENCOUNTER — Encounter: Payer: Self-pay | Admitting: Emergency Medicine

## 2018-01-13 ENCOUNTER — Emergency Department
Admission: EM | Admit: 2018-01-13 | Discharge: 2018-01-13 | Disposition: A | Discharge status: Home / Self Care | Payer: Self-pay | Attending: Emergency Medicine | Admitting: Emergency Medicine

## 2018-01-13 ENCOUNTER — Other Ambulatory Visit: Payer: Self-pay

## 2018-01-13 DIAGNOSIS — F17210 Nicotine dependence, cigarettes, uncomplicated: Secondary | ICD-10-CM | POA: Insufficient documentation

## 2018-01-13 DIAGNOSIS — B349 Viral infection, unspecified: Secondary | ICD-10-CM | POA: Insufficient documentation

## 2018-01-13 MED ORDER — GUAIFENESIN-CODEINE 100-10 MG/5ML PO SOLN: 5.0000 mL | ORAL | 0 refills | Status: DC | PRN

## 2018-01-13 NOTE — ED Notes (Signed)
Flu like symptoms including cough, congestion, vomiting, and body aches for 2 days.  Unlabored. NAD.

## 2018-01-13 NOTE — ED Provider Notes (Signed)
[facility] Emergency Department Provider Note  ____________________________________________   First MD Initiated Contact with Patient 01/13/18 1755     (approximate)  I have reviewed the triage vital signs and the nursing notes.   HISTORY  Chief Complaint Emesis and Nasal Congestion   HPI Matthew Odonnell is a 32 y.o. male is here complaint of nasal congestion, cough, body aches and vomiting times 3 days.  Patient is also had diarrhea.  Patient states initially had vomiting x5 and loose stools x2.  Patient states that he has not experienced any today and has been drinking fluids.  He has had a low-grade temp at home that he is been treating with over-the-counter medication.  He also has developed a productive cough that does not allow him to sleep at night.  He rates his pain as an 8 out of 10.  History reviewed. No pertinent past medical history.  There are no active problems to display for this patient.   Past Surgical History:  Procedure Laterality Date  . MANDIBLE SURGERY      Prior to Admission medications   Medication Sig Start Date End Date Taking? Authorizing Provider  guaiFENesin-codeine 100-10 MG/5ML syrup Take 5 mLs by mouth every 4 (four) hours as needed. 01/13/18   Tommi RumpsSummers, Adael Culbreath L, PA-C    Allergies Penicillins and Sulfa antibiotics  History reviewed. No pertinent family history.  Social History Social History   Tobacco Use  . Smoking status: Current Every Day Smoker    Packs/day: 1.00    Types: Cigarettes  . Smokeless tobacco: Never Used  Substance Use Topics  . Alcohol use: No  . Drug use: No    Review of Systems Constitutional: No fever/chills Eyes: No visual changes. ENT: No sore throat.  Positive for nasal congestion. Cardiovascular: Denies chest pain. Respiratory: Denies shortness of breath.  For cough. Gastrointestinal: No abdominal pain.  No nausea, positive vomiting.  Positive diarrhea.   Musculoskeletal:  Positive for body aches. Skin: Negative for rash. Neurological: Negative for headaches, focal weakness or numbness. ___________________________________________   PHYSICAL EXAM:  VITAL SIGNS: ED Triage Vitals  Enc Vitals Group     BP 01/13/18 1753 132/76     Pulse Rate 01/13/18 1753 78     Resp 01/13/18 1753 18     Temp 01/13/18 1753 98.3 F (36.8 C)     Temp Source 01/13/18 1753 Oral     SpO2 01/13/18 1753 97 %     Weight 01/13/18 1758 140 lb (63.5 kg)     Height --      Head Circumference --      Peak Flow --      Pain Score 01/13/18 1757 8     Pain Loc --      Pain Edu? --      Excl. in GC? --    Constitutional: Alert and oriented. Well appearing and in no acute distress. Eyes: Conjunctivae are normal.  Head: Atraumatic. Nose: Mild congestion/no rhinnorhea.  EACs are clear.  TMs are dull bilaterally but no injection or erythema. Mouth/Throat: Mucous membranes are moist.  Oropharynx non-erythematous. Neck: No stridor.   Hematological/Lymphatic/Immunilogical: No cervical lymphadenopathy. Cardiovascular: Normal rate, regular rhythm. Grossly normal heart sounds.  Good peripheral circulation. Respiratory: Normal respiratory effort.  No retractions. Lungs CTAB. Gastrointestinal: Soft and nontender. No distention.  Bowel sounds are normoactive x4 quadrants at this time. Musculoskeletal: Moves upper and lower extremities without any difficulty.  Normal gait was noted. Neurologic:  Normal  speech and language. No gross focal neurologic deficits are appreciated.  Skin:  Skin is warm, dry and intact. No rash noted. Psychiatric: Mood and affect are normal. Speech and behavior are normal.  ____________________________________________   LABS (all labs ordered are listed, but only abnormal results are displayed)  Labs Reviewed - No data to display  PROCEDURES  Procedure(s) performed: None  Procedures  Critical Care performed:  No  ____________________________________________   INITIAL IMPRESSION / ASSESSMENT AND PLAN / ED COURSE Patient is to increase fluids.  He was given a prescription for guaifenesin with codeine to take as needed for congestion and cough.  He will continue with Tylenol and ibuprofen as needed for fever.  He will follow-up with [facility] if any continued problems.  He was given a note to remain out of work.  ____________________________________________   FINAL CLINICAL IMPRESSION(S) / ED DIAGNOSES  Final diagnoses:  Viral illness     ED Discharge Orders        Ordered    guaiFENesin-codeine 100-10 MG/5ML syrup  Every 4 hours PRN     01/13/18 1821       Note:  This document was prepared using Dragon voice recognition software and may include unintentional dictation errors.    Tommi Rumps, PA-C 01/13/18 1825    Arnaldo Natal, MD 01/19/18 [phone]

## 2018-01-13 NOTE — Discharge Instructions (Signed)
[facility] if any continued problems.  Drink clear liquids frequently to stay hydrated.  Tylenol or ibuprofen as needed for fever or body aches.  Begin using Robitussin-AC as needed for cough and congestion.  This medication contains codeine and should not be taken while driving.

## 2018-01-13 NOTE — ED Triage Notes (Signed)
Here for body aches, congestion, cough, and vomiting. Last episode vomiting was this morning. No diarrhea.  Unlabored.

## 2018-03-02 ENCOUNTER — Emergency Department: Payer: Self-pay

## 2018-03-02 ENCOUNTER — Encounter: Payer: Self-pay | Admitting: Emergency Medicine

## 2018-03-02 ENCOUNTER — Emergency Department
Admission: EM | Admit: 2018-03-02 | Discharge: 2018-03-02 | Discharge status: Against Medical Advice | Payer: Self-pay | Attending: Emergency Medicine | Admitting: Emergency Medicine

## 2018-03-02 ENCOUNTER — Other Ambulatory Visit: Payer: Self-pay

## 2018-03-02 ENCOUNTER — Emergency Department
Admission: EM | Admit: 2018-03-02 | Discharge: 2018-03-02 | Disposition: A | Discharge status: Home / Self Care | Payer: Self-pay | Attending: Emergency Medicine | Admitting: Emergency Medicine

## 2018-03-02 DIAGNOSIS — Z5321 Procedure and treatment not carried out due to patient leaving prior to being seen by health care provider: Secondary | ICD-10-CM | POA: Insufficient documentation

## 2018-03-02 DIAGNOSIS — R112 Nausea with vomiting, unspecified: Secondary | ICD-10-CM | POA: Insufficient documentation

## 2018-03-02 DIAGNOSIS — F17210 Nicotine dependence, cigarettes, uncomplicated: Secondary | ICD-10-CM | POA: Insufficient documentation

## 2018-03-02 DIAGNOSIS — J069 Acute upper respiratory infection, unspecified: Secondary | ICD-10-CM | POA: Insufficient documentation

## 2018-03-02 DIAGNOSIS — B9789 Other viral agents as the cause of diseases classified elsewhere: Secondary | ICD-10-CM

## 2018-03-02 MED ORDER — GUAIFENESIN-CODEINE 100-10 MG/5ML PO SOLN: 5.0000 mL | ORAL | 0 refills | Status: AC | PRN

## 2018-03-02 NOTE — ED Notes (Signed)
Pt refuses bloodwork, st "I don't like needles"; explained reasoning & importance of performing protocols; pt cont to refuse stating that he is going to go home and come back in the moring "when his grandmother can come sit with him"

## 2018-03-02 NOTE — ED Triage Notes (Signed)
Says 3 days congestion, cough, achey and headache.

## 2018-03-02 NOTE — ED Provider Notes (Signed)
[facility] Emergency Department Provider Note   ____________________________________________   First MD Initiated Contact with Patient 03/02/18 1259     (approximate)  I have reviewed the triage vital signs and the nursing notes.   HISTORY  Chief Complaint Cough    HPI Matthew Odonnell is a 32 y.o. male patient complained of 3 days of cough, congestion, body aches, and headache.  Patient state nausea and vomiting has resolved.  Denies diarrhea.  No fever.  No relief over-the-counter medications.  Positive tobacco use.  History reviewed. No pertinent past medical history.  There are no active problems to display for this patient.   Past Surgical History:  Procedure Laterality Date  . MANDIBLE SURGERY      Prior to Admission medications   Medication Sig Start Date End Date Taking? Authorizing Provider  guaiFENesin-codeine 100-10 MG/5ML syrup Take 5 mLs by mouth every 4 (four) hours as needed. 03/02/18   Joni ReiningSmith, Ronald K, PA-C    Allergies Penicillins and Sulfa antibiotics  No family history on file.  Social History Social History   Tobacco Use  . Smoking status: Current Every Day Smoker    Packs/day: 1.00    Types: Cigarettes  . Smokeless tobacco: Never Used  Substance Use Topics  . Alcohol use: No  . Drug use: No    Review of Systems Constitutional: No fever/chills Eyes: No visual changes. ENT: No sore throat. Cardiovascular: Denies chest pain. Respiratory: Cough and chest congestion. Gastrointestinal: No abdominal pain.  No nausea, no vomiting.  No diarrhea.  No constipation. Genitourinary: Negative for dysuria. Musculoskeletal: Negative for back pain. Skin: Negative for rash. Neurological: Negative for headaches, focal weakness or numbness. Allergic/Immunilogical: Penicillin and sulfa antibiotics. ____________________________________________   PHYSICAL EXAM:  VITAL SIGNS: ED Triage Vitals  Enc Vitals Group     BP  03/02/18 1155 113/71     Pulse Rate 03/02/18 1155 68     Resp 03/02/18 1155 16     Temp 03/02/18 1155 (!) 97.5 F (36.4 C)     Temp Source 03/02/18 1155 Oral     SpO2 03/02/18 1155 96 %     Weight 03/02/18 1156 140 lb (63.5 kg)     Height 03/02/18 1156 5\' 11"  (1.803 m)     Head Circumference --      Peak Flow --      Pain Score 03/02/18 1156 6     Pain Loc --      Pain Edu? --      Excl. in GC? --    Constitutional: Alert and oriented. Well appearing and in no acute distress. Eyes: Conjunctivae are normal. PERRL. EOMI. Head: Atraumatic. Nose: No congestion/rhinnorhea. Mouth/Throat: Mucous membranes are moist.  Oropharynx non-erythematous. Neck: No stridor. Hematological/Lymphatic/Immunilogical: No cervical lymphadenopathy. Cardiovascular: Normal rate, regular rhythm. Grossly normal heart sounds.  Good peripheral circulation. Respiratory: Normal respiratory effort.  No retractions. Lungs with Rhonchi. Gastrointestinal: Soft and nontender. No distention. No abdominal bruits. No CVA tenderness. Musculoskeletal: No lower extremity tenderness nor edema.  No joint effusions. Neurologic:  Normal speech and language. No gross focal neurologic deficits are appreciated. No gait instability. Skin:  Skin is warm, dry and intact. No rash noted. Psychiatric: Mood and affect are normal. Speech and behavior are normal.  ____________________________________________   LABS (all labs ordered are listed, but only abnormal results are displayed)  Labs Reviewed - No data to display ____________________________________________  EKG   ____________________________________________  RADIOLOGY  No acute findings on x-ray  of the chest.  Official radiology report(s): Dg Chest 2 View  Result Date: 03/02/2018 CLINICAL DATA:  Joint pain.  Body aches. EXAM: CHEST - 2 VIEW COMPARISON:  No prior. FINDINGS: Mediastinum and hilar structures normal. Lungs are clear. No pleural effusion or pneumothorax.  Heart size normal. No acute bony abnormality. IMPRESSION: No acute cardiopulmonary disease. Electronically Signed   By: Maisie Fus  Register   On: 03/02/2018 14:00    ____________________________________________   PROCEDURES  Procedure(s) performed: None  Procedures  Critical Care performed:   ____________________________________________   INITIAL IMPRESSION / ASSESSMENT AND PLAN / ED COURSE  As part of my medical decision making, I reviewed the following data within the electronic MEDICAL RECORD NUMBER    Cough and congestion secondary to viral respiratory infection.  Patient given discharge care instructions.  Patient advised take medication as directed.  Patient given a work note.  Patient advised [facility].      ____________________________________________   FINAL CLINICAL IMPRESSION(S) / ED DIAGNOSES  Final diagnoses:  Viral URI with cough     ED Discharge Orders        Ordered    guaiFENesin-codeine 100-10 MG/5ML syrup  Every 4 hours PRN     03/02/18 1431       Note:  This document was prepared using Dragon voice recognition software and may include unintentional dictation errors.    Joni Reining, PA-C 03/02/18 1433    Darci Current, MD 03/02/18 [phone]

## 2018-03-02 NOTE — ED Triage Notes (Addendum)
Patient ambulatory to triage with steady gait, without difficulty or distress noted; pt reports N/V/D since yesterday accomp by HA and congestion; denies abd pain

## 2018-03-02 NOTE — ED Notes (Signed)
Pt ambulatory to POV without difficulty. VSS. NAD. Discharge instructions, RX and follow up reviewed. All questions addressed.  

## 2018-03-02 NOTE — ED Notes (Signed)
See triage note  Presents with joint pain and body aches since Friday   Now has congestion and occasional prod cough    Afebrile on arrival

## 2019-06-25 ENCOUNTER — Other Ambulatory Visit: Payer: Self-pay

## 2019-06-25 ENCOUNTER — Emergency Department
Admission: EM | Admit: 2019-06-25 | Discharge: 2019-06-25 | Discharge status: Against Medical Advice | Payer: Self-pay | Attending: Emergency Medicine | Admitting: Emergency Medicine

## 2019-06-25 ENCOUNTER — Encounter: Payer: Self-pay | Admitting: Emergency Medicine

## 2019-06-25 DIAGNOSIS — F17210 Nicotine dependence, cigarettes, uncomplicated: Secondary | ICD-10-CM | POA: Insufficient documentation

## 2019-06-25 DIAGNOSIS — T40601A Poisoning by unspecified narcotics, accidental (unintentional), initial encounter: Secondary | ICD-10-CM

## 2019-06-25 DIAGNOSIS — T507X1A Poisoning by analeptics and opioid receptor antagonists, accidental (unintentional), initial encounter: Secondary | ICD-10-CM | POA: Insufficient documentation

## 2019-06-25 MED ORDER — NALOXONE HCL 4 MG/0.1ML NA LIQD: NASAL | 2 refills | Status: AC

## 2019-06-25 MED ORDER — NALOXONE HCL 4 MG/0.1ML NA LIQD
1.0000 | Freq: Once | NASAL | Status: AC
  Administered 2019-06-25: 1 via NASAL
  Filled 2019-06-25: qty 4

## 2019-06-25 NOTE — ED Provider Notes (Signed)
[facility] Emergency Department Provider Note  Time seen: 6:04 PM  I have reviewed the triage vital signs and the nursing notes.   HISTORY  Chief Complaint Overdose  HPI Matthew Odonnell is a 33 y.o. male with no past medical history presents to the emergency department by EMS after an apparent overdose.  According to EMS patient had apparently snorted a substance at the home, patient had turned blue and stopped breathing, a friend at the residence performed CPR and called EMS.  When police arrived they gave the patient Narcan and the patient awoke.  EMS states upon their arrival the patient was awake and alert and oriented, was refusing transport however the police told the patient that they either go with EMS or they are going to IVC him and make him come to the hospital so the patient went with EMS.  On arrival the patient is awake alert oriented, is refusing all exam and vitals at this time.  States he just wants to leave.  I had a discussion with the patient regarding opioids and how Narcan can wear off and the patient could relapse into a state of unconsciousness, patient understands, states he is going to stay with a friend but does not want a medical work-up and wishes to leave.   No past medical history on file.  There are no active problems to display for this patient.   Past Surgical History:  Procedure Laterality Date  . MANDIBLE SURGERY      Prior to Admission medications   Medication Sig Start Date End Date Taking? Authorizing Provider  guaiFENesin-codeine 100-10 MG/5ML syrup Take 5 mLs by mouth every 4 (four) hours as needed. 03/02/18   Sable Feil, PA-C    Allergies  Allergen Reactions  . Penicillins Other (See Comments)    Childhood allergy  . Sulfa Antibiotics Other (See Comments)    Childhood allergy    No family history on file.  Social History Social History   Tobacco Use  . Smoking status: Current Every Day Smoker   Packs/day: 1.00    Types: Cigarettes  . Smokeless tobacco: Never Used  Substance Use Topics  . Alcohol use: No  . Drug use: No    Review of Systems Unable to obtain a review of systems secondary to patient cooperation. ____________________________________________   PHYSICAL EXAM:  Constitutional: Patient is awake and alert, no distress.  Refusing physical exam at this time. Eyes: Normal exam ENT      Head: Normocephalic and atraumatic. Cardiovascular: Refuses physical exam Respiratory: Normal respiratory effort without tachypnea nor retractions.  Speaking in full and complete sentences without issues. Gastrointestinal: Refuses physical exam. Musculoskeletal: Ambulatory without issue.  Refuses physical exam. Neurologic: Awake alert speaking in clear and complete sentences.  Ambulating. Psychiatric: Mood and affect appear normal.  ____________________________________________   INITIAL IMPRESSION / ASSESSMENT AND PLAN / ED COURSE  Pertinent labs & imaging results that were available during my care of the patient were reviewed by me and considered in my medical decision making (see chart for details).   Patient here for likely opioid overdose although the patient does not admit to anything in particular.  Patient received brief CPR by the friend and then awoke with Narcan after police arrival.  Patient refused EMS transport however was given an ultimatum to come with EMS or police so the patient came with EMS.  Here the patient continues to refuse evaluation, he is being polite and calm but states he does  not need to be evaluated does not wish for an evaluation and wants to go home.  I discussed with the patient possibility of Narcan wearing off and relapsing into a state of unconsciousness.  Patient states he understands but still wishes to leave.  Has the patient is awake alert oriented and able to make his own medical decisions we will have the patient sign out [place].   I will prescribe a Narcan kit for the patient's home use although the patient states he will never be doing this again.  Matthew Odonnell was evaluated in Emergency Department on 06/25/2019 for the symptoms described in the history of present illness. He was evaluated in the context of the global COVID-19 pandemic, which necessitated consideration that the patient might be at risk for infection with the SARS-CoV-2 virus that causes COVID-19. Institutional protocols and algorithms that pertain to the evaluation of patients at risk for COVID-19 are in a state of rapid change based on information released by regulatory bodies including the CDC and federal and state organizations. These policies and algorithms were followed during the patient's care in the ED.  ____________________________________________   FINAL CLINICAL IMPRESSION(S) / ED DIAGNOSES  Opioid overdose   Harvest Dark, MD 06/25/19 [phone]

## 2019-06-25 NOTE — ED Triage Notes (Signed)
Pt states that he was with friends when the overdose occurred, and one of his friends started CPR.

## 2019-06-25 NOTE — ED Notes (Signed)
Pt refused vitals 

## 2020-07-02 ENCOUNTER — Ambulatory Visit: Payer: Self-pay | Attending: Internal Medicine

## 2020-07-02 DIAGNOSIS — Z23 Encounter for immunization: Secondary | ICD-10-CM

## 2020-07-02 NOTE — Progress Notes (Signed)
   Covid-19 Vaccination Clinic  Name:  Matthew Odonnell    MRN: [id number] DOB: 09-Feb-1986  07/02/2020  Mr. Mcinerny was observed post Covid-19 immunization for 15 minutes without incident. He was provided with Vaccine Information Sheet and instruction to access the V-Safe system.   Mr. Yeldell was instructed to call 911 with any severe reactions post vaccine: Marland Kitchen Difficulty breathing  . Swelling of face and throat  . A fast heartbeat  . A bad rash all over body  . Dizziness and weakness   Immunizations Administered    Name Date Dose VIS Date Route   Moderna COVID-19 Vaccine 07/02/2020 12:27 PM 0.5 mL 10/2019 Intramuscular   Manufacturer: Moderna   Lot: [id number]   NDC: 63846-659-93

## 2020-08-06 ENCOUNTER — Ambulatory Visit: Payer: Self-pay

## 2022-09-05 ENCOUNTER — Emergency Department
Admission: EM | Admit: 2022-09-05 | Discharge: 2022-09-05 | Discharge status: Against Medical Advice | Payer: Medicaid Other | Attending: Emergency Medicine | Admitting: Emergency Medicine

## 2022-09-05 ENCOUNTER — Other Ambulatory Visit: Payer: Self-pay

## 2022-09-05 DIAGNOSIS — K047 Periapical abscess without sinus: Secondary | ICD-10-CM | POA: Insufficient documentation

## 2022-09-05 DIAGNOSIS — Z5321 Procedure and treatment not carried out due to patient leaving prior to being seen by health care provider: Secondary | ICD-10-CM | POA: Insufficient documentation

## 2022-09-05 NOTE — ED Notes (Signed)
Pt to front desk asking if we knew where his uncle is. Pt informed we did not know. Pt states that he has decided that he does not want to be seen. Pt ambulatory out of the ED in NAD.

## 2022-09-05 NOTE — ED Triage Notes (Signed)
Pt in with co abscess tooth states since am. Friend states he also has some altered behavior, pt is on suboxone. Pt denies any drug use.
